# Patient Record
Sex: Male | Born: 1935 | Race: White | Hispanic: No | Marital: Married | Smoking: Former smoker
Health system: Southern US, Community
[De-identification: ages and names within clinical notes are randomized; demographics above are authoritative.]

## PROBLEM LIST (undated history)

## (undated) DIAGNOSIS — K5790 Diverticulosis of intestine, part unspecified, without perforation or abscess without bleeding: Secondary | ICD-10-CM

## (undated) DIAGNOSIS — C801 Malignant (primary) neoplasm, unspecified: Secondary | ICD-10-CM

## (undated) DIAGNOSIS — I10 Essential (primary) hypertension: Secondary | ICD-10-CM

## (undated) DIAGNOSIS — J45909 Unspecified asthma, uncomplicated: Secondary | ICD-10-CM

## (undated) DIAGNOSIS — J111 Influenza due to unidentified influenza virus with other respiratory manifestations: Secondary | ICD-10-CM

## (undated) DIAGNOSIS — N200 Calculus of kidney: Secondary | ICD-10-CM

## (undated) HISTORY — PX: PROSTATECTOMY: SHX69

## (undated) HISTORY — PX: CATARACT EXTRACTION: SUR2

---

## 1998-03-21 ENCOUNTER — Emergency Department (HOSPITAL_COMMUNITY): Admission: EM | Admit: 1998-03-21 | Discharge: 1998-03-21 | Payer: Self-pay | Admitting: Emergency Medicine

## 1998-04-25 ENCOUNTER — Ambulatory Visit (HOSPITAL_COMMUNITY): Admission: RE | Admit: 1998-04-25 | Discharge: 1998-04-25 | Payer: Self-pay | Admitting: Urology

## 1998-04-25 ENCOUNTER — Encounter: Payer: Self-pay | Admitting: Urology

## 2000-01-29 ENCOUNTER — Other Ambulatory Visit: Admission: RE | Admit: 2000-01-29 | Discharge: 2000-01-29 | Payer: Self-pay | Admitting: Urology

## 2000-03-16 ENCOUNTER — Inpatient Hospital Stay (HOSPITAL_COMMUNITY): Admission: RE | Admit: 2000-03-16 | Discharge: 2000-03-19 | Payer: Self-pay | Admitting: Urology

## 2002-04-25 ENCOUNTER — Inpatient Hospital Stay (HOSPITAL_COMMUNITY): Admission: EM | Admit: 2002-04-25 | Discharge: 2002-04-27 | Payer: Self-pay | Admitting: Urology

## 2002-05-04 ENCOUNTER — Ambulatory Visit (HOSPITAL_BASED_OUTPATIENT_CLINIC_OR_DEPARTMENT_OTHER): Admission: RE | Admit: 2002-05-04 | Discharge: 2002-05-04 | Payer: Self-pay | Admitting: Urology

## 2002-06-03 ENCOUNTER — Ambulatory Visit (HOSPITAL_COMMUNITY): Admission: RE | Admit: 2002-06-03 | Discharge: 2002-06-03 | Payer: Self-pay | Admitting: Gastroenterology

## 2002-06-09 ENCOUNTER — Encounter: Admission: RE | Admit: 2002-06-09 | Discharge: 2002-06-09 | Payer: Self-pay | Admitting: Gastroenterology

## 2002-06-09 ENCOUNTER — Encounter: Payer: Self-pay | Admitting: Gastroenterology

## 2004-10-28 ENCOUNTER — Emergency Department (HOSPITAL_COMMUNITY): Admission: EM | Admit: 2004-10-28 | Discharge: 2004-10-28 | Payer: Self-pay | Admitting: Family Medicine

## 2012-11-18 ENCOUNTER — Encounter (HOSPITAL_COMMUNITY): Payer: Self-pay | Admitting: Adult Health

## 2012-11-18 ENCOUNTER — Emergency Department (HOSPITAL_COMMUNITY)
Admission: EM | Admit: 2012-11-18 | Discharge: 2012-11-18 | Disposition: A | Discharge status: Home / Self Care | Payer: Medicare Other | Attending: Emergency Medicine | Admitting: Emergency Medicine

## 2012-11-18 DIAGNOSIS — Z23 Encounter for immunization: Secondary | ICD-10-CM | POA: Insufficient documentation

## 2012-11-18 DIAGNOSIS — S01309A Unspecified open wound of unspecified ear, initial encounter: Secondary | ICD-10-CM | POA: Insufficient documentation

## 2012-11-18 DIAGNOSIS — S01311A Laceration without foreign body of right ear, initial encounter: Secondary | ICD-10-CM

## 2012-11-18 DIAGNOSIS — Y9289 Other specified places as the place of occurrence of the external cause: Secondary | ICD-10-CM | POA: Insufficient documentation

## 2012-11-18 DIAGNOSIS — W208XXA Other cause of strike by thrown, projected or falling object, initial encounter: Secondary | ICD-10-CM | POA: Insufficient documentation

## 2012-11-18 DIAGNOSIS — Y93I9 Activity, other involving external motion: Secondary | ICD-10-CM | POA: Insufficient documentation

## 2012-11-18 MED ORDER — CEPHALEXIN 500 MG PO CAPS: 500.0000 mg | ORAL_CAPSULE | Freq: Four times a day (QID) | ORAL | Status: DC

## 2012-11-18 MED ORDER — OXYCODONE-ACETAMINOPHEN 5-325 MG PO TABS
2.0000 | ORAL_TABLET | Freq: Once | ORAL | Status: AC
  Administered 2012-11-18: 2 via ORAL
  Filled 2012-11-18: qty 2

## 2012-11-18 MED ORDER — CEPHALEXIN 250 MG PO CAPS
500.0000 mg | ORAL_CAPSULE | Freq: Once | ORAL | Status: AC
  Administered 2012-11-18: 500 mg via ORAL
  Filled 2012-11-18: qty 2

## 2012-11-18 MED ORDER — ONDANSETRON 4 MG PO TBDP: 8.0000 mg | ORAL_TABLET | Freq: Once | ORAL | Status: DC

## 2012-11-18 MED ORDER — ONDANSETRON 4 MG PO TBDP
ORAL_TABLET | ORAL | Status: AC
  Administered 2012-11-18: 4 mg via ORAL
  Filled 2012-11-18: qty 1

## 2012-11-18 MED ORDER — TETANUS-DIPHTH-ACELL PERTUSSIS 5-2.5-18.5 LF-MCG/0.5 IM SUSP
0.5000 mL | Freq: Once | INTRAMUSCULAR | Status: AC
  Administered 2012-11-18: 0.5 mL via INTRAMUSCULAR
  Filled 2012-11-18: qty 0.5

## 2012-11-18 MED ORDER — ONDANSETRON 4 MG PO TBDP
4.0000 mg | ORAL_TABLET | Freq: Once | ORAL | Status: AC
  Administered 2012-11-18: 4 mg via ORAL

## 2012-11-18 MED ORDER — HYDROCODONE-ACETAMINOPHEN 5-325 MG PO TABS: 1.0000 | ORAL_TABLET | ORAL | Status: DC | PRN

## 2012-11-18 NOTE — ED Notes (Signed)
Pressure dressing done.Wound still ozzy.

## 2012-11-18 NOTE — ED Provider Notes (Signed)
Suffered laceration to right ear after he was struck with a tree branch. No other injury. No loss of consciousness.  Doug Sou, MD 11/18/12 [phone]

## 2012-11-18 NOTE — ED Notes (Signed)
Held pressure for 10 minutes with quick-clot.  Ear continues to bleed.

## 2012-11-18 NOTE — ED Provider Notes (Signed)
History    This chart was scribed for Marlon Pel (PA) non-physician practitioner working with Doug Sou, MD by Sofie Rower, ED Scribe. This patient was seen in room TR11C/TR11C and the patient's care was started at 6:00PM.   CSN: [id number]  Arrival date & time 11/18/12  1650   First MD Initiated Contact with Patient 11/18/12 1800      Chief Complaint  Patient presents with  . Ear Laceration    (Consider location/radiation/quality/duration/timing/severity/associated sxs/prior treatment) The history is provided by the patient. No language interpreter was used.    Robert Curry is a 79 y.o. male , with no known medical hx, who presents to the Emergency Department complaining of sudden, moderate, ear laceration, located at the right ear, onset today (11/18/12). The pt reports he was riding his lawn mower underneath an apple tree earlier this afternoon, when a branch from the apple tree suddenly fell, impacting upon his right ear. The pt informs he is not taking any blood thinners at the present point and time. Furthermore, the pt reports his tetanus immunization is not up to date.  The pt denies any LOC.   The pt does not smoke or drink alcohol.   PCP is Dr. Clarene Duke.    History reviewed. No pertinent past medical history.  History reviewed. No pertinent past surgical history.  History reviewed. No pertinent family history.  History  Substance Use Topics  . Smoking status: Never Smoker   . Smokeless tobacco: Not on file  . Alcohol Use: No      Review of Systems  HENT: Positive for ear pain.   Skin: Positive for wound.  All other systems reviewed and are negative.    Allergies  Review of patient's allergies indicates no known allergies.  Home Medications   Current Outpatient Rx  Name  Route  Sig  Dispense  Refill  . naproxen sodium (ANAPROX) 220 MG tablet   Oral   Take 220 mg by mouth 2 (two) times daily as needed (pain).         . cephALEXin (KEFLEX) 500  MG capsule   Oral   Take 1 capsule (500 mg total) by mouth 4 (four) times daily.   40 capsule   0   . HYDROcodone-acetaminophen (NORCO/VICODIN) 5-325 MG per tablet   Oral   Take 1 tablet by mouth every 4 (four) hours as needed for pain.   15 tablet   0     BP 160/98  Pulse 94  Temp(Src) 97.8 F (36.6 C) (Oral)  Resp 16  SpO2 100%  Physical Exam  Nursing note and vitals reviewed. Constitutional: He is oriented to person, place, and time. He appears well-developed and well-nourished. No distress.  HENT:  Head: Normocephalic and atraumatic.  Right Ear: Tympanic membrane and ear canal normal. Right ear exhibits lacerations (4 cm laceration to ear that is actively bleeding).  Ears:  Eyes: EOM are normal.  Neck: Neck supple. No tracheal deviation present.  Cardiovascular: Normal rate.   Pulmonary/Chest: Effort normal. No respiratory distress.  Musculoskeletal: Normal range of motion.  Neurological: He is alert and oriented to person, place, and time.  Skin: Skin is warm and dry.  Psychiatric: He has a normal mood and affect. His behavior is normal.    ED Course  Procedures (including critical care time)  DIAGNOSTIC STUDIES: Oxygen Saturation is 100% on room air, normal by my interpretation.    COORDINATION OF CARE:  6:04 PM- Treatment plan concerning consultation with attending  physician discussed with patient. Pt agrees with treatment.  6:07 PM- Recheck. Treatment plan concerning pain management discussed with patient. Pt agrees with treatment.  6:08 PM- Attending physician (Dr. Ethelda Chick) evaluates patient. Dr. Ethelda Chick recommends ear block, laceration repair, ear dressing, and follow up with ENT. Treatment plan discussed with patient. Pt agrees with treatment.   6:12 PM- Treatment plan concerning laceration repair discussed with patient. Pt agrees with treatment.  LACERATION REPAIR PROCEDURE NOTE The patient's identification was confirmed and consent was  obtained. This procedure was performed by Marlon Pel (PA), MD at 6:13 PM. Site: Right ear Sterile procedures observed: Betadine Anesthetic used (type and amt): Lidocaine 2% Suture type/size: Prolene 3-0, Prolene 4-0 Length: 4CM # of Sutures: 7 Technique:SIMPLE INTERRUPTED  ComplexityComplex Antibx ointment applied: bacitracin Tetanus  ordered Site anesthetized, irrigated with NS, explored without evidence of foreign body, wound well approximated, site covered with dry, sterile dressing.  Patient tolerated procedure well without complications. Instructions for care discussed verbally and patient provided with additional written instructions for homecare and f/u.     6:57PM- Recheck. Dr. Ethelda Chick evaluates patient. Treatment plan discussed with patient. Pt agrees with treatment.           Labs Reviewed - No data to display No results found.   1. Laceration of ear without foreign body, right, initial encounter       MDM  I spoke with ENT Dr. Ezzard Standing who has asked patient to follow-up in his office. Pt is to call tomorrow.  Dr. Ethelda Chick evaluated wound after I completed procedure. Patient will follow-up with ENT int he next few days. Sutures to be removed by ENT.  Started on Keflex and given Rx for Vicodin for pain.  Pt has been advised of the symptoms that warrant their return to the ED. Patient has voiced understanding and has agreed to follow-up with the PCP or specialist.  I personally performed the services described in this documentation, which was scribed in my presence. The recorded information has been reviewed and is accurate.   Dorthula Matas, PA-C 11/18/12 1910  Dorthula Matas, PA-C 11/18/12 1959

## 2012-11-18 NOTE — ED Notes (Addendum)
Presents with right ear laceration from a tree branch. Laceration to outer ear. Tetanus not up to date.  No blood thinners.

## 2012-11-18 NOTE — ED Notes (Signed)
Pt discharged.Vital signs stable and GCS 15 

## 2012-11-18 NOTE — ED Notes (Signed)
Dr Shela Commons assessed pt. PA suturing ear.

## 2012-11-19 NOTE — ED Provider Notes (Signed)
Medical screening examination/treatment/procedure(s) were conducted as a shared visit with non-physician practitioner(s) and myself.  I personally evaluated the patient during the encounter  Doug Sou, MD 11/19/12 [phone]

## 2014-09-25 ENCOUNTER — Ambulatory Visit (INDEPENDENT_AMBULATORY_CARE_PROVIDER_SITE_OTHER): Payer: Medicare Other | Admitting: Family Medicine

## 2014-09-25 VITALS — BP 149/80 | HR 62 | Temp 98.2°F | Resp 18 | Wt 188.0 lb

## 2014-09-25 DIAGNOSIS — S40862A Insect bite (nonvenomous) of left upper arm, initial encounter: Secondary | ICD-10-CM

## 2014-09-25 DIAGNOSIS — W57XXXA Bitten or stung by nonvenomous insect and other nonvenomous arthropods, initial encounter: Secondary | ICD-10-CM | POA: Diagnosis not present

## 2014-09-25 MED ORDER — TRIAMCINOLONE ACETONIDE 0.1 % EX CREA: 1.0000 "application " | TOPICAL_CREAM | Freq: Three times a day (TID) | CUTANEOUS | Status: DC

## 2014-09-25 NOTE — Patient Instructions (Signed)
No antibiotics are indicated at this time. It is not been shown that taking antibiotics helps to prevent someone from getting problems. However, if you have any symptoms, you should come in immediately as it is very important that he get treated at that time. These would include fever, rashes, or severe headache or body aches. They usually occur after a week or more. However since you do not know exactly when you got the bite, come in at any time if getting symptoms.  If the bite is itching too much you can use a little triamcinolone cream on there several times a day.

## 2014-09-25 NOTE — Progress Notes (Signed)
Subjective: 81 year old man who had a tick on his left arm the removed earlier today and flushed down the toilet. He has a lot of itching where he got bit. The pharmacist recommended he come over here and get treated. He says he has had tick bites all his life and never had a problem from them. He does have a headache today. The tick probably got on their yesterday. It was still very tiny. He has been working outdoors. He has not had any fevers or rashes.  Objective: Palpable induration about 1 cm in diameter on the left elbow where he was bit. His rapid skin makes it such that I cannot tell exactly where the bite was.  Assessment: Tick bite  Plan: Triamcinolone cream if needed for itching No antibiotic prophylaxis indicated at this time Return if any symptoms at all

## 2014-10-04 ENCOUNTER — Emergency Department (HOSPITAL_COMMUNITY): Payer: Medicare Other

## 2014-10-04 ENCOUNTER — Emergency Department (HOSPITAL_COMMUNITY)
Admission: EM | Admit: 2014-10-04 | Discharge: 2014-10-04 | Disposition: A | Discharge status: Home / Self Care | Payer: Medicare Other | Attending: Emergency Medicine | Admitting: Emergency Medicine

## 2014-10-04 ENCOUNTER — Encounter (HOSPITAL_COMMUNITY): Payer: Self-pay | Admitting: Emergency Medicine

## 2014-10-04 DIAGNOSIS — I10 Essential (primary) hypertension: Secondary | ICD-10-CM | POA: Diagnosis not present

## 2014-10-04 DIAGNOSIS — Z8719 Personal history of other diseases of the digestive system: Secondary | ICD-10-CM | POA: Insufficient documentation

## 2014-10-04 DIAGNOSIS — N201 Calculus of ureter: Secondary | ICD-10-CM | POA: Diagnosis not present

## 2014-10-04 DIAGNOSIS — N23 Unspecified renal colic: Secondary | ICD-10-CM | POA: Diagnosis not present

## 2014-10-04 DIAGNOSIS — R109 Unspecified abdominal pain: Secondary | ICD-10-CM | POA: Diagnosis present

## 2014-10-04 DIAGNOSIS — Z87891 Personal history of nicotine dependence: Secondary | ICD-10-CM | POA: Diagnosis not present

## 2014-10-04 HISTORY — DX: Diverticulosis of intestine, part unspecified, without perforation or abscess without bleeding: K57.90

## 2014-10-04 HISTORY — DX: Essential (primary) hypertension: I10

## 2014-10-04 HISTORY — DX: Calculus of kidney: N20.0

## 2014-10-04 LAB — URINALYSIS, ROUTINE W REFLEX MICROSCOPIC
Bilirubin Urine: NEGATIVE
Glucose, UA: NEGATIVE mg/dL
Hgb urine dipstick: NEGATIVE
Ketones, ur: NEGATIVE mg/dL
Leukocytes, UA: NEGATIVE
NITRITE: NEGATIVE
Protein, ur: NEGATIVE mg/dL
SPECIFIC GRAVITY, URINE: 1.026 (ref 1.005–1.030)
UROBILINOGEN UA: 0.2 mg/dL (ref 0.0–1.0)
pH: 5 (ref 5.0–8.0)

## 2014-10-04 MED ORDER — OXYCODONE-ACETAMINOPHEN 5-325 MG PO TABS: ORAL_TABLET | ORAL | Status: DC

## 2014-10-04 MED ORDER — OXYCODONE-ACETAMINOPHEN 5-325 MG PO TABS
2.0000 | ORAL_TABLET | Freq: Once | ORAL | Status: AC
  Administered 2014-10-04: 2 via ORAL
  Filled 2014-10-04: qty 2

## 2014-10-04 MED ORDER — NAPROXEN 250 MG PO TABS: 250.0000 mg | ORAL_TABLET | Freq: Two times a day (BID) | ORAL | Status: DC

## 2014-10-04 MED ORDER — ONDANSETRON HCL 4 MG PO TABS: 4.0000 mg | ORAL_TABLET | Freq: Three times a day (TID) | ORAL | Status: DC | PRN

## 2014-10-04 NOTE — ED Provider Notes (Signed)
CSN: [id number]     Arrival date & time 10/04/14  1641 History   First MD Initiated Contact with Patient 10/04/14 1723     Chief Complaint  Patient presents with  . Nephrolithiasis  . Flank Pain      HPI Pt was seen at 1755. Per pt, c/o sudden onset and persistence of waxing and waning left sided flank "pain" that began 2 days ago.  Pt describes the pain as "like my last kidney stone 15 years ago." States "I feel bloated." Has been associated with nausea. States he took an alleve at 1430 PTA without improvement in his symptoms. Pt states he called his Urologist's office PTA, and was sent to the ED for further evaluation. Denies testicular pain/swelling, no dysuria/hematuria, no abd pain, no vomiting/diarrhea, no black or blood in emesis, no CP/SOB.    Uro: [facility] Dr. Junious Silk Past Medical History  Diagnosis Date  . Kidney stones   . Hypertension   . Diverticulosis    Past Surgical History  Procedure Laterality Date  . Prostatectomy      History  Substance Use Topics  . Smoking status: Former Research scientist (life sciences)  . Smokeless tobacco: Not on file  . Alcohol Use: No    Review of Systems ROS: Statement: All systems negative except as marked or noted in the HPI; Constitutional: Negative for fever and chills. ; ; Eyes: Negative for eye pain, redness and discharge. ; ; ENMT: Negative for ear pain, hoarseness, nasal congestion, sinus pressure and sore throat. ; ; Cardiovascular: Negative for chest pain, palpitations, diaphoresis, dyspnea and peripheral edema. ; ; Respiratory: Negative for cough, wheezing and stridor. ; ; Gastrointestinal: +nausea. Negative for vomiting, diarrhea, abdominal pain, blood in stool, hematemesis, jaundice and rectal bleeding. . ; ; Genitourinary: +flank pain. Negative for dysuria and hematuria. ; ; Genital:  No penile drainage or rash, no testicular pain or swelling, no scrotal rash or swelling. ;; Musculoskeletal: Negative for back pain and neck pain. Negative for swelling  and trauma.; ; Skin: Negative for pruritus, rash, abrasions, blisters, bruising and skin lesion.; ; Neuro: Negative for headache, lightheadedness and neck stiffness. Negative for weakness, altered level of consciousness , altered mental status, extremity weakness, paresthesias, involuntary movement, seizure and syncope.       Allergies  Review of patient's allergies indicates no known allergies.  Home Medications   Prior to Admission medications   Medication Sig Start Date End Date Taking? Authorizing Provider  naproxen sodium (ANAPROX) 220 MG tablet Take 220 mg by mouth 2 (two) times daily with a meal.   Yes Historical Provider, MD  triamcinolone cream (KENALOG) 0.1 % Apply 1 application topically 3 (three) times daily. Patient not taking: Reported on 10/04/2014 09/25/14   Posey Boyer, MD   BP 164/77 mmHg  Pulse 80  Temp(Src) 98 F (36.7 C) (Oral)  Resp 17  SpO2 99% Physical Exam  1800; Physical examination:  Nursing notes reviewed; Vital signs and O2 SAT reviewed;  Constitutional: Well developed, Well nourished, Well hydrated, In no acute distress; Head:  Normocephalic, atraumatic; Eyes: EOMI, PERRL, No scleral icterus; ENMT: Mouth and pharynx normal, Mucous membranes moist; Neck: Supple, Full range of motion, No lymphadenopathy; Cardiovascular: Regular rate and rhythm, No gallop; Respiratory: Breath sounds clear & equal bilaterally, No wheezes.  Speaking full sentences with ease, Normal respiratory effort/excursion; Chest: Nontender, Movement normal; Abdomen: Soft, Nontender, Nondistended, Normal bowel sounds; Genitourinary: No CVA tenderness; Spine:  No midline CS, TS, LS tenderness.;; Extremities: Pulses normal, No tenderness,  No edema, No calf edema or asymmetry.; Neuro: AA&Ox3, Major CN grossly intact.  Speech clear. No gross focal motor or sensory deficits in extremities.; Skin: Color normal, Warm, Dry.   ED Course  Procedures     EKG Interpretation None      MDM   MDM Reviewed: previous chart, nursing note and vitals Reviewed previous: labs Interpretation: labs and CT scan     Results for orders placed or performed during the hospital encounter of 10/04/14  Urinalysis, Routine w reflex microscopic  Result Value Ref Range   Color, Urine YELLOW YELLOW   APPearance CLEAR CLEAR   Specific Gravity, Urine 1.026 1.005 - 1.030   pH 5.0 5.0 - 8.0   Glucose, UA NEGATIVE NEGATIVE mg/dL   Hgb urine dipstick NEGATIVE NEGATIVE   Bilirubin Urine NEGATIVE NEGATIVE   Ketones, ur NEGATIVE NEGATIVE mg/dL   Protein, ur NEGATIVE NEGATIVE mg/dL   Urobilinogen, UA 0.2 0.0 - 1.0 mg/dL   Nitrite NEGATIVE NEGATIVE   Leukocytes, UA NEGATIVE NEGATIVE   Ct Abdomen Pelvis Wo Contrast 10/04/2014   CLINICAL DATA:  Left flank pain  EXAM: CT ABDOMEN AND PELVIS WITHOUT CONTRAST  TECHNIQUE: Multidetector CT imaging of the abdomen and pelvis was performed following the standard protocol without IV contrast.  COMPARISON:  None.  FINDINGS: Lower chest: No pleural or pericardial effusion. Atelectasis is noted within the posterior lung bases.  Hepatobiliary: No suspicious liver abnormalities identified. Multiple stones identified within the gallbladder measuring up to 6 mm. No gallbladder wall thickening or pericholecystic fluid. There is no biliary dilatation.  Pancreas: Negative  Spleen: The spleen appears normal.  Adrenals/Urinary Tract: Normal adrenal glands. Right renal cyst containing mural calcification measures 2.2 cm, image 34/series 2. This is incompletely characterized without IV contrast. Punctate calcification within the right kidney measures 2-3 mm, image 34/series 2. There is asymmetric left-sided hydronephrosis. At the left UPJ there is a stone which measures 7 mm, image 39/series 2. Mild left-sided perinephric fat stranding. Urinary bladder appears normal for degree of distention.  Stomach/Bowel: The stomach appears normal. The small bowel loops have a normal course and  caliber without obstruction. The appendix is visualized and appears normal. Multiple distal colonic diverticula identified without acute inflammation.  Vascular/Lymphatic: Calcified atherosclerotic disease involves the abdominal aorta. No aneurysm. No enlarged retroperitoneal or mesenteric adenopathy. No enlarged pelvic or inguinal lymph nodes. Previous pelvic lymph node dissection.  Reproductive: Previous prostatectomy.  Other: There is no ascites or focal fluid collections within the abdomen or pelvis.  Musculoskeletal: No aggressive lytic or sclerotic bone lesion identified.  IMPRESSION: 1. Asymmetric left-sided hydronephrosis secondary to 7 mm UPJ calculus. 2. Complex right renal cyst containing mural calcification identified. More definitive assessment with renal protocol CT or contrast enhanced renal MRI is advised. 3. Atherosclerotic disease. 4. Prior prostatectomy. No specific features identified to suggest metastatic disease. 5. Gallstones.   Electronically Signed   By: Kerby Moors M.D.   On: 10/04/2014 18:23    2020:  Pt states he feels "much better now" and wants to go home. Pt has tol PO well without N/V. VS remain stable. T/C to Urology Dr. Matilde Sprang, case discussed, including:  HPI, pertinent PM/SHx, VS/PE, dx testing, ED course and treatment:  Agrees with ED workup/tx, requests to have pt call the office tomorrow for f/u appt. Dx and testing, as well as d/w Urologist, d/w pt and family.  Questions answered.  Verb understanding, agreeable to d/c home with outpt f/u.   Francine Graven, DO 10/07/14  0722 

## 2014-10-04 NOTE — Discharge Instructions (Signed)
°Emergency Department Resource Guide °1) Find a Doctor and Pay Out of Pocket °Although you won't have to find out who is covered by your insurance plan, it is a good idea to ask around and get recommendations. You will then need to call the office and see if the doctor you have chosen will accept you as a new patient and what types of options they offer for patients who are self-pay. Some doctors offer discounts or will set up payment plans for their patients who do not have insurance, but you will need to ask so you aren't surprised when you get to your appointment. ° °2) Contact Your Local Health Department °Not all health departments have doctors that can see patients for sick visits, but many do, so it is worth a call to see if yours does. If you don't know where your local health department is, you can check in your phone book. The CDC also has a tool to help you locate your state's health department, and many state websites also have listings of all of their local health departments. ° °3) Find a Walk-in Clinic °If your illness is not likely to be very severe or complicated, you may want to try a walk in clinic. These are popping up all over the country in pharmacies, drugstores, and shopping centers. They're usually staffed by nurse practitioners or physician assistants that have been trained to treat common illnesses and complaints. They're usually fairly quick and inexpensive. However, if you have serious medical issues or chronic medical problems, these are probably not your best option. ° °No Primary Care Doctor: °- Call [facility] at  832-8000 - they can help you locate a primary care doctor that  accepts your insurance, provides certain services, etc. °- Physician Referral Service- 1-800-533-3463 ° °Chronic Pain Problems: °Organization         Address  Phone   Notes  °[facility]  (336) 297-2271 Patients need to be referred by their primary care doctor.  ° °Medication  Assistance: °Organization         Address  Phone   Notes  °[place] [facility] [address] °[place], [place] [address] (336) 641-8030 --Must be a resident of [place] °-- Must have NO insurance coverage whatsoever (no Medicaid/ Medicare, etc.) °-- The pt. MUST have a primary care doctor that directs their care regularly and follows them in the community °  °[facility]  (866) 331-1348   °[employer]  (888) 892-1162   ° °Agencies that provide inexpensive medical care: °Organization         Address  Phone   Notes  °[facility]  (336) 832-8035   °[facility]    (336) 832-7272   °[facility] [address] °[place], [place] [address] (336) 832-4777   °[facility] [address], °[place] (336) 271-4999   °[facility]    (336) 373-0678   °[facility]    (336) 272-1050   °[facility] ° [address], [place] Phone:  (336) 832-4444, Fax:  (336) 832-4440 Hours of Operation:  9 am - 6 pm, M-F.  Also accepts Medicaid/Medicare and self-pay.  °[facility] ° [address], [place] Phone: (336) 832-3150, Fax: (336) [phone]. Hours of Operation:  8:30 am - 5:30 pm, M-F.  Also accepts Medicaid and self-pay.  °[facility] [place] [address], [place] Phone: (336) 878-6027   °[facility] [address], [place], [place] (336)723-1848, Ext. 123 Mondays & Thursdays: 7-9 AM.  First 15 patients are seen on a first come, first serve basis. °  ° °Medicaid-accepting [place] Providers: ° °Organization         Address  Phone   Notes  °[facility] [address], [place] (336) 641-2100 Also accepts self-pay patients.  °[facility] [address], [place] ° (336) 856-9996   °[facility] [address], [place]  (336) 288-8857   °[facility] [address] [place] [address], [place] (336) 299-7000   °Veita Bland [address], [place]  ° (336) 373-1557 Only accepts [place] Access Medicaid patients after they have their name applied to their card.  ° °Self-Pay (no insurance) in [place]: ° °Organization         Address  Phone   Notes  °Sickle Cell Patients, [facility] [address], [place] (336) 832-1970   °[facility] [address], [place] (336) 832-4400   °[facility] ° [address], [place] (336) 992-4800   °[facility]/Dr. Osei-Bonsu ° [address] [place] [address], [place] or [address], [place] (336) [phone] Phone number for both [place] and [place] locations is the same.  °[facility] [address], [place] (336) 299-0000   °[facility] [address] [place] [address], [place] or [address] (336) 852-7530 °(336) 878-2260   °[facility] [address], [place] (336) 350-1642, phone; (336) 294-5005, fax Sees patients 1st and 3rd Saturday of every month.  Must not qualify for public or private insurance (i.e. Medicaid, Medicare, [place] Health Choice, Veterans' Benefits) • Household income should be no more than 200% of the poverty level •The clinic cannot treat you if you are pregnant or think you are pregnant • Sexually transmitted diseases are not treated at the clinic.  ° ° °Dental Care: °Organization         Address  Phone  Notes  °[place] [facility] [address],  (336) 641-6152 Accepts children up to age 21 who are enrolled in Medicaid or [place] Health Choice; pregnant women with a Medicaid card; and children who have applied for Medicaid or [place] Health Choice, but were declined, whose parents can pay a reduced fee at time of service.  °[place]  [facility] [place]  [address], [place] (336) 641-7733 Accepts children up to age 21 who are enrolled in Medicaid or [place] Health Choice; pregnant women with a Medicaid card; and children who have applied for Medicaid or  Health Choice, but were declined, whose parents can pay a reduced fee at time of service.  °[facility] ° [address],  (336) 641-4533 Patients are seen by appointment only. Walk-ins are not accepted. [facility] will see patients 18 years of age and older. °Monday - Tuesday (8am-5pm) °Most Wednesdays (8:30-5pm) °$30 per visit, cash only  °[facility] ° [address], [place] (336) 641-4533 Patients are seen by appointment only. Walk-ins are not accepted. [facility] will see patients 18 years of age and older. °One   Wednesday Evening (Monthly: Volunteer Based).  $30 per visit, cash only  °[facility]  (919) [phone] for adults; Children under age 4, call Graduate Pediatric Dentistry at ([phone]) [phone]. Children aged 4-14, please call ([phone]) [phone] to request a pediatric application. ° Dental services are provided in all areas of dental care including fillings, crowns and bridges, complete and partial dentures, implants, gum treatment, root canals, and extractions. Preventive care is also provided. Treatment is provided to both adults and children. °Patients are selected via a lottery and there is often a waiting list. °  °[facility] [address], °[place] ° (336) 763-8833 [email or web address] °  °[facility] [address], [place], [place] (336)723-1848, Ext. 123 Second and Fourth Thursday of each month, opens at 6:30 AM; Clinic ends at 9 AM.  Patients are seen on a first-come first-served basis, and a limited number are seen during each clinic.  ° °[facility] ° [address], [place], [place] (336) [phone]    Eligibility Requirements °You must have lived in [place], [place], or [place] counties for at least the last three months. °  You cannot be eligible for state or federal sponsored healthcare insurance, including Veterans Administration, Medicaid, or Medicare. °  You generally cannot be eligible for healthcare insurance through your employer.  °  How to apply: °Eligibility screenings are held every Tuesday and Wednesday afternoon from 1:00 pm until 4:00 pm. You do not need an appointment for the interview!  °[facility] [address], [place], [place] 336-631-2330   °[facility]  336-342-8273   °[facility]  336-703-3100   °[facility]  336-570-6415   ° °Behavioral Health Resources in the Community: °Intensive Outpatient Programs °Organization         Address  Phone  Notes  °[facility] [address], [place], [place] 336-878-6098   °[facility] [address], [place], [place] 336-832-9800   °[facility] [address], [place], [place] ° 336-882-2125   °[facility] [address],  °[place], [place] 1-800-853-5163 or 336-641-4981   °Substance Abuse Resources °Organization         Address  Phone  Notes  °[facility]  336-882-2125   °[facility]  336-784-9470   °[facility]  336-285-9073   °[facility]  336-845-3988   °Residential & Outpatient Substance Abuse Program  1-800-659-3381   °Psychological Services °Organization         Address  Phone  Notes  ° Health  336- 832-9600   °[facility]  336- 378-7881   °[facility] [address], [place] 1-800-853-5163 or 336-641-4981   ° °Mobile Crisis Teams °Organization         Address  Phone  Notes  °[facility], Mobile Crisis Care Unit  1-877-626-1772   °Assertive °[facility] ° [address]  [place], [place] 336-834-9664   °Sharon DeEsch [address] °[place] 336-554-5454   ° °Self-Help/Support Groups °Organization         Address  Phone             Notes  °[facility] - variety of support groups  336- 373-1402 Call for more information  °Narcotics Anonymous (NA), [facility] [address], °[place]  2 meetings at this location  ° °  Residential Treatment Programs Organization         Address  Phone  Notes  [facility] [address]    [place]  1-(804) 033-0221   [facility]  [address], [place] [address], [place], [place]   [facility] [place], [place] 530-450-1218 Admissions: 8am-3pm M-F  [facility] [place] [address],    [place], [place] 341-937-9024   [facility] [address] [place], [place], [place]   [facility] [address]  [place], [place]   [facility] Intensive Outpatient [address], [place], [place]   [facility]  [place], [place] 1-662-772-9761 or 325-490-0771   [facility] [address], [place], [place] Accepts Medicaid  [facility] [address],  [place] 1-(640)469-1042 Substance Abuse/Addiction Treatment   [facility]         Address  Phone  Notes  [facility]  732-655-7719   Domenic Schwab, PhD [address] [place], [place]   918-127-7624 or 210-089-9695   [place], [place] 646-843-4364   [facility] [address] [place] [address], [place], [place] 339-705-6224 Insurance/Medicaid/sponsorship through [facility] [address] [place]                                    [place], [place] 515-701-2989 [place] [address], [place] [phone]    Dr. Adele Schilder  [phone]   [facility] Dept. 1) [address] [place] 2) [place] 3)  [address], [place] [phone]  [phone]   [place] 629-406-1416 or ([phone] (After Hours)      Take the prescriptions as directed.  Call your Urologist tomorrow to schedule a follow up appointment in the next 2 days.  Return to the Emergency Department immediately if worsening.

## 2014-10-04 NOTE — ED Notes (Addendum)
Pt c/o left flank pain starting x2 days. [facility] and the office referred patient here. C/o dysuria. Denies N/V/D/fevers. No other c/c. Hx kidney stones. Took one Aleve at 1430 with no alleviation of symptoms. Also c/o abdominal bloating. Does not feel like he needs to urinate at this time.

## 2014-10-05 LAB — URINE CULTURE: Colony Count: 15000

## 2015-08-09 ENCOUNTER — Ambulatory Visit
Admission: RE | Admit: 2015-08-09 | Discharge: 2015-08-09 | Disposition: A | Discharge status: Home / Self Care | Payer: Medicare Other | Source: Ambulatory Visit | Attending: Family Medicine | Admitting: Family Medicine

## 2015-08-09 ENCOUNTER — Other Ambulatory Visit: Payer: Self-pay | Admitting: Family Medicine

## 2015-08-09 DIAGNOSIS — R05 Cough: Secondary | ICD-10-CM

## 2015-08-09 DIAGNOSIS — R059 Cough, unspecified: Secondary | ICD-10-CM

## 2015-12-04 ENCOUNTER — Other Ambulatory Visit: Payer: Self-pay

## 2015-12-04 ENCOUNTER — Encounter (HOSPITAL_COMMUNITY)
Admission: EM | Disposition: A | Discharge status: Home / Self Care | Payer: Self-pay | Source: Home / Self Care | Attending: Cardiology

## 2015-12-04 ENCOUNTER — Emergency Department (HOSPITAL_COMMUNITY): Payer: Medicare Other

## 2015-12-04 ENCOUNTER — Inpatient Hospital Stay (HOSPITAL_COMMUNITY)
Admission: EM | Admit: 2015-12-04 | Discharge: 2015-12-05 | DRG: 287 | Disposition: A | Discharge status: Home / Self Care | Payer: Medicare Other | Attending: Cardiology | Admitting: Cardiology

## 2015-12-04 ENCOUNTER — Encounter (HOSPITAL_COMMUNITY): Payer: Self-pay | Admitting: Emergency Medicine

## 2015-12-04 DIAGNOSIS — R0902 Hypoxemia: Secondary | ICD-10-CM | POA: Diagnosis present

## 2015-12-04 DIAGNOSIS — R0600 Dyspnea, unspecified: Secondary | ICD-10-CM

## 2015-12-04 DIAGNOSIS — I739 Peripheral vascular disease, unspecified: Secondary | ICD-10-CM | POA: Diagnosis present

## 2015-12-04 DIAGNOSIS — I248 Other forms of acute ischemic heart disease: Principal | ICD-10-CM | POA: Diagnosis present

## 2015-12-04 DIAGNOSIS — I2510 Atherosclerotic heart disease of native coronary artery without angina pectoris: Secondary | ICD-10-CM | POA: Diagnosis present

## 2015-12-04 DIAGNOSIS — E782 Mixed hyperlipidemia: Secondary | ICD-10-CM | POA: Diagnosis present

## 2015-12-04 DIAGNOSIS — I10 Essential (primary) hypertension: Secondary | ICD-10-CM | POA: Diagnosis present

## 2015-12-04 DIAGNOSIS — Z859 Personal history of malignant neoplasm, unspecified: Secondary | ICD-10-CM

## 2015-12-04 DIAGNOSIS — I214 Non-ST elevation (NSTEMI) myocardial infarction: Secondary | ICD-10-CM | POA: Diagnosis present

## 2015-12-04 DIAGNOSIS — Z87891 Personal history of nicotine dependence: Secondary | ICD-10-CM

## 2015-12-04 DIAGNOSIS — J45909 Unspecified asthma, uncomplicated: Secondary | ICD-10-CM | POA: Diagnosis present

## 2015-12-04 DIAGNOSIS — I2584 Coronary atherosclerosis due to calcified coronary lesion: Secondary | ICD-10-CM | POA: Diagnosis present

## 2015-12-04 DIAGNOSIS — I700 Atherosclerosis of aorta: Secondary | ICD-10-CM | POA: Diagnosis present

## 2015-12-04 HISTORY — PX: CARDIAC CATHETERIZATION: SHX172

## 2015-12-04 HISTORY — DX: Malignant (primary) neoplasm, unspecified: C80.1

## 2015-12-04 HISTORY — DX: Influenza due to unidentified influenza virus with other respiratory manifestations: J11.1

## 2015-12-04 LAB — I-STAT CHEM 8, ED
BUN: 25 mg/dL — ABNORMAL HIGH (ref 6–20)
CALCIUM ION: 1.21 mmol/L (ref 1.13–1.30)
CHLORIDE: 106 mmol/L (ref 101–111)
Creatinine, Ser: 1 mg/dL (ref 0.61–1.24)
Glucose, Bld: 111 mg/dL — ABNORMAL HIGH (ref 65–99)
HCT: 44 % (ref 39.0–52.0)
Hemoglobin: 15 g/dL (ref 13.0–17.0)
Potassium: 4 mmol/L (ref 3.5–5.1)
Sodium: 145 mmol/L (ref 135–145)
TCO2: 28 mmol/L (ref 0–100)

## 2015-12-04 LAB — POCT ACTIVATED CLOTTING TIME: ACTIVATED CLOTTING TIME: 214 s

## 2015-12-04 LAB — I-STAT TROPONIN, ED
TROPONIN I, POC: 0 ng/mL (ref 0.00–0.08)
Troponin i, poc: 0.12 ng/mL (ref 0.00–0.08)

## 2015-12-04 LAB — CBC WITH DIFFERENTIAL/PLATELET
BASOS PCT: 0 %
Basophils Absolute: 0 10*3/uL (ref 0.0–0.1)
EOS ABS: 0.4 10*3/uL (ref 0.0–0.7)
EOS PCT: 4 %
HEMATOCRIT: 43.1 % (ref 39.0–52.0)
Hemoglobin: 13.2 g/dL (ref 13.0–17.0)
Lymphocytes Relative: 32 %
Lymphs Abs: 2.9 10*3/uL (ref 0.7–4.0)
MCH: 29.3 pg (ref 26.0–34.0)
MCHC: 30.6 g/dL (ref 30.0–36.0)
MCV: 95.8 fL (ref 78.0–100.0)
MONO ABS: 0.6 10*3/uL (ref 0.1–1.0)
MONOS PCT: 6 %
Neutro Abs: 5.2 10*3/uL (ref 1.7–7.7)
Neutrophils Relative %: 58 %
PLATELETS: 211 10*3/uL (ref 150–400)
RBC: 4.5 MIL/uL (ref 4.22–5.81)
RDW: 13.3 % (ref 11.5–15.5)
WBC: 9.1 10*3/uL (ref 4.0–10.5)

## 2015-12-04 LAB — LIPID PANEL
CHOL/HDL RATIO: 3.2 ratio
CHOLESTEROL: 178 mg/dL (ref 0–200)
HDL: 56 mg/dL (ref >40)
LDL CALC: 117 mg/dL — AB (ref 0–99)
TRIGLYCERIDES: 24 mg/dL (ref <150)
VLDL: 5 mg/dL (ref 0–40)

## 2015-12-04 LAB — TROPONIN I
TROPONIN I: 0.29 ng/mL — AB (ref <0.031)
TROPONIN I: 0.33 ng/mL — AB (ref <0.031)
TROPONIN I: 0.41 ng/mL — AB (ref <0.031)

## 2015-12-04 LAB — I-STAT VENOUS BLOOD GAS, ED
Bicarbonate: 28.1 mEq/L — ABNORMAL HIGH (ref 20.0–24.0)
O2 SAT: 50 %
PCO2 VEN: 62 mmHg — AB (ref 45.0–50.0)
TCO2: 30 mmol/L (ref 0–100)
pH, Ven: 7.265 (ref 7.250–7.300)
pO2, Ven: 32 mmHg (ref 31.0–45.0)

## 2015-12-04 LAB — BRAIN NATRIURETIC PEPTIDE: B NATRIURETIC PEPTIDE 5: 51.3 pg/mL (ref 0.0–100.0)

## 2015-12-04 LAB — D-DIMER, QUANTITATIVE: D-Dimer, Quant: 2.49 ug/mL-FEU — ABNORMAL HIGH (ref 0.00–0.50)

## 2015-12-04 LAB — PROTIME-INR
INR: 1.08 (ref 0.00–1.49)
Prothrombin Time: 14.2 seconds (ref 11.6–15.2)

## 2015-12-04 SURGERY — LEFT HEART CATH AND CORONARY ANGIOGRAPHY

## 2015-12-04 MED ORDER — ATORVASTATIN CALCIUM 80 MG PO TABS: 80.0000 mg | ORAL_TABLET | Freq: Every day | ORAL | Status: DC

## 2015-12-04 MED ORDER — HEPARIN SODIUM (PORCINE) 1000 UNIT/ML IJ SOLN
INTRAMUSCULAR | Status: DC | PRN
  Administered 2015-12-04: 2000 [IU] via INTRAVENOUS
  Administered 2015-12-04: 6000 [IU] via INTRAVENOUS

## 2015-12-04 MED ORDER — MIDAZOLAM HCL 2 MG/2ML IJ SOLN
INTRAMUSCULAR | Status: DC | PRN
  Administered 2015-12-04: 1 mg via INTRAVENOUS

## 2015-12-04 MED ORDER — SODIUM CHLORIDE 0.9% FLUSH
3.0000 mL | Freq: Two times a day (BID) | INTRAVENOUS | Status: DC
  Administered 2015-12-04 – 2015-12-05 (×2): 3 mL via INTRAVENOUS

## 2015-12-04 MED ORDER — SODIUM CHLORIDE 0.9 % WEIGHT BASED INFUSION: 3.0000 mL/kg/h | INTRAVENOUS | Status: DC

## 2015-12-04 MED ORDER — FENTANYL CITRATE (PF) 100 MCG/2ML IJ SOLN
INTRAMUSCULAR | Status: DC | PRN
  Administered 2015-12-04: 25 ug via INTRAVENOUS

## 2015-12-04 MED ORDER — SODIUM CHLORIDE 0.9 % IV SOLN: 250.0000 mL | INTRAVENOUS | Status: DC | PRN

## 2015-12-04 MED ORDER — HEPARIN SODIUM (PORCINE) 5000 UNIT/ML IJ SOLN: 4000.0000 [IU] | Freq: Once | INTRAMUSCULAR | Status: DC

## 2015-12-04 MED ORDER — ASPIRIN EC 81 MG PO TBEC: 81.0000 mg | DELAYED_RELEASE_TABLET | Freq: Every day | ORAL | Status: DC

## 2015-12-04 MED ORDER — NITROGLYCERIN 0.4 MG SL SUBL
0.4000 mg | SUBLINGUAL_TABLET | SUBLINGUAL | Status: DC | PRN
Start: 2015-12-04 — End: 2015-12-05

## 2015-12-04 MED ORDER — IOPAMIDOL (ISOVUE-370) INJECTION 76%
INTRAVENOUS | Status: AC
  Filled 2015-12-04: qty 50

## 2015-12-04 MED ORDER — ASPIRIN EC 81 MG PO TBEC
81.0000 mg | DELAYED_RELEASE_TABLET | Freq: Every day | ORAL | Status: DC
  Administered 2015-12-04: 81 mg via ORAL
  Filled 2015-12-04: qty 1

## 2015-12-04 MED ORDER — SODIUM CHLORIDE 0.9% FLUSH: 3.0000 mL | Freq: Two times a day (BID) | INTRAVENOUS | Status: DC

## 2015-12-04 MED ORDER — SODIUM CHLORIDE 0.9 % WEIGHT BASED INFUSION: 1.0000 mL/kg/h | INTRAVENOUS | Status: DC

## 2015-12-04 MED ORDER — ONDANSETRON HCL 4 MG/2ML IJ SOLN: 4.0000 mg | Freq: Four times a day (QID) | INTRAMUSCULAR | Status: DC | PRN

## 2015-12-04 MED ORDER — IOPAMIDOL (ISOVUE-370) INJECTION 76%
INTRAVENOUS | Status: DC | PRN
  Administered 2015-12-04: 120 mL

## 2015-12-04 MED ORDER — VERAPAMIL HCL 2.5 MG/ML IV SOLN
INTRA_ARTERIAL | Status: DC | PRN
  Administered 2015-12-04: 5 mL via INTRA_ARTERIAL

## 2015-12-04 MED ORDER — IOPAMIDOL (ISOVUE-370) INJECTION 76%
INTRAVENOUS | Status: AC
  Filled 2015-12-04: qty 100

## 2015-12-04 MED ORDER — METOPROLOL TARTRATE 25 MG PO TABS
25.0000 mg | ORAL_TABLET | Freq: Two times a day (BID) | ORAL | Status: DC
  Administered 2015-12-04 – 2015-12-05 (×2): 25 mg via ORAL
  Filled 2015-12-04 (×3): qty 1

## 2015-12-04 MED ORDER — LIDOCAINE HCL (PF) 1 % IJ SOLN
INTRAMUSCULAR | Status: DC | PRN
  Administered 2015-12-04: 2 mL via INTRADERMAL

## 2015-12-04 MED ORDER — SODIUM CHLORIDE 0.9 % WEIGHT BASED INFUSION
3.0000 mL/kg/h | INTRAVENOUS | Status: AC
  Administered 2015-12-04 (×2): 3 mL/kg/h via INTRAVENOUS

## 2015-12-04 MED ORDER — ONDANSETRON HCL 4 MG/2ML IJ SOLN
4.0000 mg | Freq: Once | INTRAMUSCULAR | Status: DC
  Filled 2015-12-04: qty 2

## 2015-12-04 MED ORDER — ENOXAPARIN SODIUM 40 MG/0.4ML ~~LOC~~ SOLN
40.0000 mg | SUBCUTANEOUS | Status: DC
  Administered 2015-12-05: 40 mg via SUBCUTANEOUS
  Filled 2015-12-04: qty 0.4

## 2015-12-04 MED ORDER — HEPARIN (PORCINE) IN NACL 2-0.9 UNIT/ML-% IJ SOLN
INTRAMUSCULAR | Status: AC
  Filled 2015-12-04: qty 1000

## 2015-12-04 MED ORDER — HEPARIN (PORCINE) IN NACL 100-0.45 UNIT/ML-% IJ SOLN
1150.0000 [IU]/h | INTRAMUSCULAR | Status: DC
  Administered 2015-12-04: 1150 [IU]/h via INTRAVENOUS
  Filled 2015-12-04: qty 250

## 2015-12-04 MED ORDER — HEPARIN SODIUM (PORCINE) 1000 UNIT/ML IJ SOLN
INTRAMUSCULAR | Status: AC
  Filled 2015-12-04: qty 1

## 2015-12-04 MED ORDER — ACETAMINOPHEN 325 MG PO TABS: 650.0000 mg | ORAL_TABLET | ORAL | Status: DC | PRN

## 2015-12-04 MED ORDER — VERAPAMIL HCL 2.5 MG/ML IV SOLN
INTRAVENOUS | Status: AC
  Filled 2015-12-04: qty 2

## 2015-12-04 MED ORDER — HEPARIN (PORCINE) IN NACL 2-0.9 UNIT/ML-% IJ SOLN
INTRAMUSCULAR | Status: DC | PRN
  Administered 2015-12-04: 1000 mL

## 2015-12-04 MED ORDER — HEPARIN BOLUS VIA INFUSION
4000.0000 [IU] | Freq: Once | INTRAVENOUS | Status: AC
  Administered 2015-12-04: 4000 [IU] via INTRAVENOUS
  Filled 2015-12-04: qty 4000

## 2015-12-04 MED ORDER — ALPRAZOLAM 0.5 MG PO TABS: 1.0000 mg | ORAL_TABLET | Freq: Two times a day (BID) | ORAL | Status: DC | PRN

## 2015-12-04 MED ORDER — IOPAMIDOL (ISOVUE-370) INJECTION 76%
INTRAVENOUS | Status: AC
  Administered 2015-12-04: 100 mL
  Filled 2015-12-04: qty 100

## 2015-12-04 MED ORDER — SODIUM CHLORIDE 0.9% FLUSH: 3.0000 mL | INTRAVENOUS | Status: DC | PRN

## 2015-12-04 MED ORDER — MIDAZOLAM HCL 2 MG/2ML IJ SOLN
INTRAMUSCULAR | Status: AC
  Filled 2015-12-04: qty 2

## 2015-12-04 MED ORDER — FENTANYL CITRATE (PF) 100 MCG/2ML IJ SOLN
INTRAMUSCULAR | Status: AC
  Filled 2015-12-04: qty 2

## 2015-12-04 SURGICAL SUPPLY — 14 items
CATH OPTICROSS 40MHZ (CATHETERS) ×3 IMPLANT
CATH OPTITORQUE TIG 4.0 5F (CATHETERS) ×3 IMPLANT
CATH VISTA GUIDE 6FR XB3.5 (CATHETERS) ×3 IMPLANT
GLIDESHEATH SLEND A-KIT 6F 20G (SHEATH) ×3 IMPLANT
KIT ESSENTIALS PG (KITS) ×3 IMPLANT
KIT HEART LEFT (KITS) ×3 IMPLANT
PACK CARDIAC CATHETERIZATION (CUSTOM PROCEDURE TRAY) ×3 IMPLANT
SLED PULL BACK IVUS (MISCELLANEOUS) ×3 IMPLANT
TRANSDUCER W/STOPCOCK (MISCELLANEOUS) ×3 IMPLANT
TUBING ART PRESS 72  MALE/FEM (TUBING)
TUBING ART PRESS 72 MALE/FEM (TUBING) IMPLANT
TUBING CIL FLEX 10 FLL-RA (TUBING) ×3 IMPLANT
WIRE COUGAR XT STRL 190CM (WIRE) ×3 IMPLANT
WIRE SAFE-T 1.5MM-J .035X260CM (WIRE) ×6 IMPLANT

## 2015-12-04 NOTE — ED Notes (Signed)
Pt coming form home.    Pt arriving to ED with CPAP on. Pt has a 20 LFA. Pt woke up from his sleep with SOB and states that "I just could not catch my breathe." Pt was tripoding, SOB, and diaphoretic upon EMS arrival. Pt lung sounds were tight and heavy. Emesis x1.  Pt ST with multifocal PVCs on EMS monitor.   Pt attempted to give himself a nebulizer treatment with no relief. Pt given 10 mg albuterol, 1 Atrovent, 2 NTG, and 125 of solumedrol from EMS.Per EMS rhales heard in lower fields.  BP 182/105 96% with CPAP 92 HR 18-20R

## 2015-12-04 NOTE — Progress Notes (Signed)
ANTICOAGULATION CONSULT NOTE - Initial Consult  Pharmacy Consult for heparin Indication: chest pain/ACS  No Known Allergies  Patient Measurements: Height: 6' (182.9 cm) Weight: 190 lb (86.183 kg) IBW/kg (Calculated) : 77.6 Heparin Dosing Weight: 86.2 kg  Vital Signs: Temp Source: Oral (05/16 0416) BP: 134/79 mmHg (05/16 1030) Pulse Rate: 75 (05/16 1030)  Labs:  Recent Labs  12/04/15 0410 12/04/15 0421 12/04/15 1005  HGB 13.2 15.0  --   HCT 43.1 44.0  --   PLT 211  --   --   CREATININE  --  1.00  --   TROPONINI  --   --  0.29*    Estimated Creatinine Clearance: 61.4 mL/min (by C-G formula based on Cr of 1).   Medical History: Past Medical History  Diagnosis Date  . Kidney stones   . Hypertension   . Diverticulosis   . Flu   . Cancer [facility])     Assessment: 71 yom with CP on admit. Pharmacy consulted to dose heparin for ACS. No AC pta. CBC wnl, no bleed documented. No PE on CTa.   Goal of Therapy:  Heparin level 0.3-0.7 units/ml Monitor platelets by anticoagulation protocol: Yes   Plan:  Heparin 4000 unit bolus Start heparin at 1150 units/h 8h HL Daily HL/CBC Mon s/sx bleeding   Elicia Lamp, PharmD, [facility] Clinical Pharmacist Pager [phone] 12/04/2015 11:16 AM

## 2015-12-04 NOTE — H&P (Signed)
Robert HAMLYN is an 82 y.o. male.   Chief Complaint: Shortness of breath HPI: Robert Curry  is a 82 y.o. male with a history of HTN, well controlled on losartan, mixed hyperlipidemia (per review of medical records, not on therapy), and remote history of 30 pack year history of smoking, quit 20 years ago. He was recently seen by his PCP for intermittent episodes of shortness of breath and a sensation that he was "wheezing or his airway was constricted". He was given an inhaler without relief in symptoms. He was then scheduled for a routine treadmill stress test in our office which revealed frequent ventricular ectopy during exercise with no ischemic changes.   Early this morning, pt was waken from his sleep gasping for breath and felt hot and became diaphoretic. He went outside to see if the cool air would relieve his symptoms, and when he didn't begin to feel better, his wife called EMS.  On EMS arrival, he became nauseous and vomited once. He was given 324mg  ASA and 2 SL Ntg with relief in symptoms. On arrival to ED, cardiac monitor revealed frequent ventricular ectopy with episode of bigeminy.  Initial troponin was negative, however repeat troponin was positive at 0.29. D-dimer also positive, chest CT negative for PE. He is presently asymptomatic without chest pain or SOB. On questioning, also admits to symptoms suggestive of claudication, right calf primarily.  Past Medical History  Diagnosis Date  . Kidney stones   . Hypertension   . Diverticulosis   . Flu   . Cancer [facility]     Past Surgical History  Procedure Laterality Date  . Prostatectomy      History reviewed. No pertinent family history. Social History:  reports that he has quit smoking. He does not have any smokeless tobacco history on file. He reports that he does not drink alcohol or use illicit drugs.  Allergies: No Known Allergies  Review of Systems - History obtained from the patient General ROS: negative for - fatigue,  fever, malaise, weight gain or weight loss Hematological and Lymphatic ROS: negative for - bleeding problems, blood clots or bruising Respiratory ROS: positive for - cough and shortness of breath negative for - hemoptysis Cardiovascular ROS: positive for - chest tightness and claudication negative for - edema or palpitations Gastrointestinal ROS: positive for - nausea and emesis x 1 this morning Neurological ROS: no TIA or stroke symptoms  Blood pressure 142/82, pulse 75, resp. rate 14, height 6' (1.829 m), weight 86.183 kg (190 lb), SpO2 95 %.   General appearance: alert, cooperative and no distress Eyes: negative Neck: no adenopathy, no carotid bruit, no JVD, supple, symmetrical, trachea midline and thyroid not enlarged, symmetric, no tenderness/mass/nodules Resp: clear to auscultation bilaterally Chest wall: no tenderness Cardio: regular rate and rhythm, S1, S2 normal, no murmur, click, rub or gallop GI: soft, non-tender; bowel sounds normal; no masses,  no organomegaly Extremities: extremities normal, atraumatic, no cyanosis or edema Pulses: Right Pulses: FEM: present 2+ and bruit, POP: barely palpable, DP: present 1+, PT: absent Left Pulses: FEM: present 2+ and bruit, POP: barely palpable, DP: present 1+, PT: absent Skin: Skin color, texture, turgor normal. No rashes or lesions Neurologic: Grossly normal  Results for orders placed or performed during the hospital encounter of 12/04/15 (from the past 48 hour(s))  CBC with Differential     Status: None   Collection Time: 12/04/15  4:10 AM  Result Value Ref Range   WBC 9.1 4.0 - 10.5 K/uL  RBC 4.50 4.22 - 5.81 MIL/uL   Hemoglobin 13.2 13.0 - 17.0 g/dL   HCT 45.443.1 09.839.0 - 11.952.0 %   MCV 95.8 78.0 - 100.0 fL   MCH 29.3 26.0 - 34.0 pg   MCHC 30.6 30.0 - 36.0 g/dL   RDW 14.713.3 82.911.5 - 56.215.5 %   Platelets 211 150 - 400 K/uL   Neutrophils Relative % 58 %   Neutro Abs 5.2 1.7 - 7.7 K/uL   Lymphocytes Relative 32 %   Lymphs Abs 2.9 0.7 -  4.0 K/uL   Monocytes Relative 6 %   Monocytes Absolute 0.6 0.1 - 1.0 K/uL   Eosinophils Relative 4 %   Eosinophils Absolute 0.4 0.0 - 0.7 K/uL   Basophils Relative 0 %   Basophils Absolute 0.0 0.0 - 0.1 K/uL  Brain natriuretic peptide     Status: None   Collection Time: 12/04/15  4:10 AM  Result Value Ref Range   B Natriuretic Peptide 51.3 0.0 - 100.0 pg/mL  D-dimer, quantitative     Status: Abnormal   Collection Time: 12/04/15  4:10 AM  Result Value Ref Range   D-Dimer, Quant 2.49 (H) 0.00 - 0.50 ug/mL-FEU    Comment: (NOTE) At the manufacturer cut-off of 0.50 ug/mL FEU, this assay has been documented to exclude PE with a sensitivity and negative predictive value of 97 to 99%.  At this time, this assay has not been approved by the FDA to exclude DVT/VTE. Results should be correlated with clinical presentation.   I-stat troponin, ED     Status: None   Collection Time: 12/04/15  4:19 AM  Result Value Ref Range   Troponin i, poc 0.00 0.00 - 0.08 ng/mL   Comment 3            Comment: Due to the release kinetics of cTnI, a negative result within the first hours of the onset of symptoms does not rule out myocardial infarction with certainty. If myocardial infarction is still suspected, repeat the test at appropriate intervals.   I-Stat venous blood gas, ED     Status: Abnormal   Collection Time: 12/04/15  4:19 AM  Result Value Ref Range   pH, Ven 7.265 7.250 - 7.300   pCO2, Ven 62.0 (H) 45.0 - 50.0 mmHg   pO2, Ven 32.0 31.0 - 45.0 mmHg   Bicarbonate 28.1 (H) 20.0 - 24.0 mEq/L   TCO2 30 0 - 100 mmol/L   O2 Saturation 50.0 %   Patient temperature HIDE    Sample type VENOUS    Comment NOTIFIED PHYSICIAN   I-stat Chem 8, ED     Status: Abnormal   Collection Time: 12/04/15  4:21 AM  Result Value Ref Range   Sodium 145 135 - 145 mmol/L   Potassium 4.0 3.5 - 5.1 mmol/L   Chloride 106 101 - 111 mmol/L   BUN 25 (H) 6 - 20 mg/dL   Creatinine, Ser 1.301.00 0.61 - 1.24 mg/dL    Glucose, Bld 865111 (H) 65 - 99 mg/dL   Calcium, Ion 7.841.21 6.961.13 - 1.30 mmol/L   TCO2 28 0 - 100 mmol/L   Hemoglobin 15.0 13.0 - 17.0 g/dL   HCT 29.544.0 28.439.0 - 13.252.0 %  Troponin I     Status: Abnormal   Collection Time: 12/04/15 10:05 AM  Result Value Ref Range   Troponin I 0.29 (H) <0.031 ng/mL    Comment:        PERSISTENTLY INCREASED TROPONIN VALUES IN THE RANGE OF  0.04-0.49 ng/mL CAN BE SEEN IN:       -UNSTABLE ANGINA       -CONGESTIVE HEART FAILURE       -MYOCARDITIS       -CHEST TRAUMA       -ARRYHTHMIAS       -LATE PRESENTING MYOCARDIAL INFARCTION       -COPD   CLINICAL FOLLOW-UP RECOMMENDED.    Ct Angio Chest Pe W/cm &/or Wo Cm  12/04/2015  CLINICAL DATA:  Shortness of breath, chest pain, diaphoresis, elevated D-dimer EXAM: CT ANGIOGRAPHY CHEST WITH CONTRAST TECHNIQUE: Multidetector CT imaging of the chest was performed using the standard protocol during bolus administration of intravenous contrast. Multiplanar CT image reconstructions and MIPs were obtained to evaluate the vascular anatomy. CONTRAST:  100 cc Isovue COMPARISON:  None. FINDINGS: Mediastinum/Lymph Nodes: Images of the thoracic inlet are unremarkable. Central airways are patent. No aortic aneurysm. Mild atherosclerotic calcifications of thoracic aorta and coronary arteries. Heart size within normal limits. No pericardial effusion. No mediastinal hematoma or adenopathy. There is no hilar adenopathy. The study is of excellent technical quality. No pulmonary embolus is noted. Lungs/Pleura: Images of the lung parenchyma shows no acute infiltrate or pulmonary edema. Mild atelectasis noted bilateral lower lobe posteriorly left greater than right. There is no pneumothorax. Mild posterior atelectasis in lingula. No pleural effusion. Upper abdomen: No adrenal gland mass is noted in visualized upper abdomen. Visualized liver and spleen is unremarkable. Visualized pancreas is unremarkable. Multiple small gallstones are noted within  gallbladder the largest measures 5 mm. Mild lobulated renal contour bilateral visualized upper kidneys. There is no nephrolithiasis. No upper hydronephrosis. Musculoskeletal: No destructive bony lesions are noted. Sagittal images of the thoracic spine shows mild degenerative changes thoracic spine. Sagittal view of the sternum is unremarkable. No rib fractures are identified. Review of the MIP images confirms the above findings. IMPRESSION: 1. No pulmonary embolus is noted. 2. No mediastinal hematoma or adenopathy. 3. No acute infiltrate or pulmonary edema. Mild atelectasis bilateral lower lobe posteriorly left greater than right. Mild atelectasis posterior aspect of the lingula. 4. Degenerative changes thoracic spine. 5. Calcified gallstones are noted within dependent gallbladder the largest measures 5 mm. Electronically Signed   By: Lahoma Crocker M.D.   On: 12/04/2015 08:19   Dg Chest Port 1 View  12/04/2015  CLINICAL DATA:  Shortness of breath, nausea. History of hypertension. EXAM: PORTABLE CHEST 1 VIEW COMPARISON:  08/09/2015 FINDINGS: Shallow inspiration. The heart size and mediastinal contours are within normal limits. Both lungs are clear. The visualized skeletal structures are unremarkable. IMPRESSION: No active disease. Electronically Signed   By: Lucienne Capers M.D.   On: 12/04/2015 04:54    Labs:   Lab Results  Component Value Date   WBC 9.1 12/04/2015   HGB 15.0 12/04/2015   HCT 44.0 12/04/2015   MCV 95.8 12/04/2015   PLT 211 12/04/2015    Recent Labs Lab 12/04/15 0421  NA 145  K 4.0  CL 106  BUN 25*  CREATININE 1.00  GLUCOSE 111*    Lipid Panel  No results found for: CHOL, TRIG, HDL, CHOLHDL, VLDL, LDLCALC  BNP (last 3 results)  Recent Labs  12/04/15 0410  BNP 51.3    HEMOGLOBIN A1C No results found for: HGBA1C, MPG  Cardiac Panel (last 3 results) Troponin (Point of Care Test)  Recent Labs  12/04/15 0948  TROPIPOC 0.12*    Recent Labs  12/04/15 1005   TROPONINI 0.29*   TSH No results for  input(s): TSH in the last 8760 hours.   Outpatient Treadmill exercise stress test 11/16/2015: Indications: Shortness of breath The resting electrocardiogram demonstrated normal sinus rhythm, normal resting conduction, PVC and normal rest repolarization. The stress electrocardiogram was normal. Patient had frequent PVC and V- Couplets at the start of the exercise, which subsided at peak exercise and reappeared in the recovery. No inducible arrhythmias. Patient exercised on Bruce protocol for 4:57 minutes and achieved 100% of Max Predicted HR (Target HR was >85% MPHR) and 6.97 METS. Stress symptoms included fatigue and dyspnea. Normal BP response. Exercise capacity was low.  EKG 12/04/2014: sinus rhythm at a rate of 96bpm, normal axis, left atrial abnormality, PRWP, frequent PVCs. No evidence of ischemia.   (Not in a hospital admission)    Current facility-administered medications:  .  acetaminophen (TYLENOL) tablet 650 mg, 650 mg, Oral, Q4H PRN, Neldon Labella, NP .  Derrill Memo ON 12/05/2015] aspirin EC tablet 81 mg, 81 mg, Oral, Daily, Neldon Labella, NP .  atorvastatin (LIPITOR) tablet 80 mg, 80 mg, Oral, q1800, Neldon Labella, NP .  heparin ADULT infusion 100 units/mL (25000 units/250 mL), 1,150 Units/hr, Intravenous, Continuous, Romona Curls, [facility], Last Rate: 11.5 mL/hr at 12/04/15 1204, 1,150 Units/hr at 12/04/15 1204 .  metoprolol tartrate (LOPRESSOR) tablet 25 mg, 25 mg, Oral, BID, Neldon Labella, NP .  nitroGLYCERIN (NITROSTAT) SL tablet 0.4 mg, 0.4 mg, Sublingual, Q5 Min x 3 PRN, Neldon Labella, NP .  ondansetron [facility]) injection 4 mg, 4 mg, Intravenous, Once, Quintella Reichert, MD, Stopped at 12/04/15 [phone] .  ondansetron (ZOFRAN) injection 4 mg, 4 mg, Intravenous, Q6H PRN, Neldon Labella, NP  Current outpatient prescriptions:  .  losartan (COZAAR) 50 MG tablet, Take 1 tablet by mouth daily., Disp: , Rfl: 4 .  PROAIR HFA  108 (90 Base) MCG/ACT inhaler, Inhale 2 puffs into the lungs as needed. wheezing, Disp: , Rfl: 0 .  naproxen (NAPROSYN) 250 MG tablet, Take 1 tablet (250 mg total) by mouth 2 (two) times daily with a meal., Disp: 14 tablet, Rfl: 0 .  ondansetron (ZOFRAN) 4 MG tablet, Take 1 tablet (4 mg total) by mouth every 8 (eight) hours as needed for nausea or vomiting., Disp: 6 tablet, Rfl: 0 .  oxyCODONE-acetaminophen (PERCOCET/ROXICET) 5-325 MG per tablet, 1 or 2 tabs PO q6h prn pain, Disp: 25 tablet, Rfl: 0 .  triamcinolone cream (KENALOG) 0.1 %, Apply 1 application topically 3 (three) times daily. (Patient not taking: Reported on 10/04/2014), Disp: 15 g, Rfl: 0    Assessment/Plan 1. NSTEMI 2. Frequent PVCs, ventricular bigeminy 3. Shortness of breath/Unstable angina 4. Benign Hypertension 5. History of tobacco use 6. Claudication  Recommendation: Pt's symptoms concerning for ACS, now with positive troponin. Recommend proceeding with coronary angiogram for evaluation of coronary anatomy. Schedule for cardiac catheterization, and possible angioplasty. We discussed regarding risks, benefits, alternatives to this including stress testing, CTA and continued medical therapy. Patient wants to proceed. Understands <1-2% risk of death, stroke, MI, urgent CABG, bleeding, infection, renal failure but not limited to these. Begin heparin gtt. Will add metoprolol and atorvastatin. Further recommendations following catheterization.  Rachel Bo, NP-C 12/04/2015, 11:32 AM [facility]. [place] Pager: [phone] Office: [phone]

## 2015-12-04 NOTE — ED Provider Notes (Signed)
Patient accepted in sign out pending CTA.  CTA without sign of PE or acute process. At the time of sign out my colleague had wanted the patient to be admitted for chest pain rule out if his CTA was normal. When I discussed this with the patient the patient said that he would much prefer to just be discharged home. I initially discussed the case on the phone with his primary care physician Dr. Rex Kras who said he could see the patient in follow-up outpatient. I asked that the patient stay for repeat troponin. Repeat troponin came back elevated. For this reason cardiology was consult it. The patient was seen in the emergency department by [facility] Dr. Irven Shelling nurse practitioner.  They admitted the patient under their care for further treatment and evaluation.  Patient was updated on results and plan of care.  Patient was started on Heparin per cardiology recommendations.  Filed Vitals:   12/04/15 0900 12/04/15 1030  BP: 122/69 134/79  Pulse: 75 75  Resp: 13 13    Physical Exam  Constitutional: No distress.  HENT:  Head: Normocephalic and atraumatic.  Eyes: EOM are normal. Pupils are equal, round, and reactive to light.  Neck: Normal range of motion. Neck supple. No JVD present.  Cardiovascular: Normal rate, normal heart sounds and intact distal pulses.   No murmur heard. Pulmonary/Chest: Effort normal. No respiratory distress. He has no wheezes. He has no rales.  Abdominal: Soft. He exhibits no distension. There is no tenderness.  Musculoskeletal: Normal range of motion. He exhibits no edema.  Neurological: He is alert. He exhibits normal muscle tone.  Skin: Skin is warm and dry. He is not diaphoretic.  Vitals reviewed.   CRITICAL CARE Performed by: Earlie Server   Total critical care time: 35 minutes  Critical care time was exclusive of separately billable procedures and treating other patients.  Critical care was necessary to treat or prevent imminent or life-threatening  deterioration.  Critical care was time spent personally by me on the following activities: development of treatment plan with patient and/or surrogate as well as nursing, discussions with consultants, evaluation of patient's response to treatment, examination of patient, obtaining history from patient or surrogate, ordering and performing treatments and interventions, ordering and review of laboratory studies, ordering and review of radiographic studies, pulse oximetry and re-evaluation of patient's condition.   Harvel Quale, MD 12/04/15 1150

## 2015-12-04 NOTE — ED Provider Notes (Signed)
CSN: [id number]     Arrival date & time 12/04/15  0407 History   First MD Initiated Contact with Patient 12/04/15 0407     Chief Complaint  Patient presents with  . Shortness of Breath      The history is provided by the patient. No language interpreter was used.   Robert Curry is a 82 y.o. male who presents to the Emergency Department complaining of SOB.  [name] awoke suddenly prior to ED arrival with severe shortness of breath. He was awoken from sleep with a sensation that he could not breathe. His wife states that he appeared panicky. He ran outside to get air and asked for 911 to be called. EMS reports the patient was diaphoretic and tripoding. He was given aspirin, nitroglycerin, albuterol, Solu-Medrol and initiated on BiPAP. He reports several weeks of neck pain. Headache started after nitroglycerin. He had some chest pain and tightness associated with shortness of breath, resolved on ED presentation. No fevers, cough, abdominal pain, lower extremity swelling or pain. He does endorse some nausea.  Past Medical History  Diagnosis Date  . Kidney stones   . Hypertension   . Diverticulosis   . Flu   . Cancer [facility])    Past Surgical History  Procedure Laterality Date  . Prostatectomy    . Cataract extraction Left    History reviewed. No pertinent family history. Social History  Substance Use Topics  . Smoking status: Former Research scientist (life sciences)  . Smokeless tobacco: Former Systems developer     Comment: quit smoking in  2001  . Alcohol Use: No    Review of Systems  All other systems reviewed and are negative.     Allergies  Review of patient's allergies indicates no known allergies.  Home Medications   Prior to Admission medications   Medication Sig Start Date End Date Taking? Authorizing Provider  losartan (COZAAR) 50 MG tablet Take 1 tablet by mouth daily. 10/31/15  Yes Historical Provider, MD  PROAIR HFA 108 (90 Base) MCG/ACT inhaler Inhale 2 puffs into the lungs as needed. wheezing  10/31/15  Yes Historical Provider, MD  naproxen (NAPROSYN) 250 MG tablet Take 1 tablet (250 mg total) by mouth 2 (two) times daily with a meal. 10/04/14   Francine Graven, DO  ondansetron (ZOFRAN) 4 MG tablet Take 1 tablet (4 mg total) by mouth every 8 (eight) hours as needed for nausea or vomiting. 10/04/14   Francine Graven, DO  oxyCODONE-acetaminophen (PERCOCET/ROXICET) 5-325 MG per tablet 1 or 2 tabs PO q6h prn pain 10/04/14   Francine Graven, DO  triamcinolone cream (KENALOG) 0.1 % Apply 1 application topically 3 (three) times daily. Patient not taking: Reported on 10/04/2014 09/25/14   Posey Boyer, MD   BP 121/61 mmHg  Pulse 77  Temp(Src) 98.3 F (36.8 C) (Oral)  Resp 18  Ht 6\' 1"  (1.854 m)  Wt 185 lb 13.6 oz (84.3 kg)  BMI 24.52 kg/m2  SpO2 95% Physical Exam  Constitutional: He is oriented to person, place, and time. He appears well-developed and well-nourished. He appears distressed.  HENT:  Head: Normocephalic and atraumatic.  Cardiovascular: Normal rate and regular rhythm.   No murmur heard. Pulmonary/Chest: Effort normal. No respiratory distress.  Decreased air movement bilaterally with crackles in the left lung base  Abdominal: Soft. There is no tenderness. There is no rebound and no guarding.  Musculoskeletal: He exhibits no tenderness.  1+ pitting edema in BLE  Neurological: He is alert and oriented to person, place,  and time.  Skin: Skin is warm and dry.  Psychiatric: He has a normal mood and affect. His behavior is normal.  Nursing note and vitals reviewed.   ED Course  Procedures (including critical care time) Labs Review Labs Reviewed  D-DIMER, QUANTITATIVE (NOT AT [facility]) - Abnormal; Notable for the following:    D-Dimer, Quant 2.49 (*)    All other components within normal limits  TROPONIN I - Abnormal; Notable for the following:    Troponin I 0.29 (*)    All other components within normal limits  TROPONIN I - Abnormal; Notable for the following:     Troponin I 0.33 (*)    All other components within normal limits  TROPONIN I - Abnormal; Notable for the following:    Troponin I 0.41 (*)    All other components within normal limits  LIPID PANEL - Abnormal; Notable for the following:    LDL Cholesterol 117 (*)    All other components within normal limits  I-STAT CHEM 8, ED - Abnormal; Notable for the following:    BUN 25 (*)    Glucose, Bld 111 (*)    All other components within normal limits  I-STAT VENOUS BLOOD GAS, ED - Abnormal; Notable for the following:    pCO2, Ven 62.0 (*)    Bicarbonate 28.1 (*)    All other components within normal limits  I-STAT TROPOININ, ED - Abnormal; Notable for the following:    Troponin i, poc 0.12 (*)    All other components within normal limits  CBC WITH DIFFERENTIAL/PLATELET  BRAIN NATRIURETIC PEPTIDE  PROTIME-INR  TROPONIN I  BASIC METABOLIC PANEL  CBC  I-STAT TROPOININ, ED  POCT ACTIVATED CLOTTING TIME    Imaging Review Ct Angio Chest Pe W/cm &/or Wo Cm  12/04/2015  CLINICAL DATA:  Shortness of breath, chest pain, diaphoresis, elevated D-dimer EXAM: CT ANGIOGRAPHY CHEST WITH CONTRAST TECHNIQUE: Multidetector CT imaging of the chest was performed using the standard protocol during bolus administration of intravenous contrast. Multiplanar CT image reconstructions and MIPs were obtained to evaluate the vascular anatomy. CONTRAST:  100 cc Isovue COMPARISON:  None. FINDINGS: Mediastinum/Lymph Nodes: Images of the thoracic inlet are unremarkable. Central airways are patent. No aortic aneurysm. Mild atherosclerotic calcifications of thoracic aorta and coronary arteries. Heart size within normal limits. No pericardial effusion. No mediastinal hematoma or adenopathy. There is no hilar adenopathy. The study is of excellent technical quality. No pulmonary embolus is noted. Lungs/Pleura: Images of the lung parenchyma shows no acute infiltrate or pulmonary edema. Mild atelectasis noted bilateral lower lobe  posteriorly left greater than right. There is no pneumothorax. Mild posterior atelectasis in lingula. No pleural effusion. Upper abdomen: No adrenal gland mass is noted in visualized upper abdomen. Visualized liver and spleen is unremarkable. Visualized pancreas is unremarkable. Multiple small gallstones are noted within gallbladder the largest measures 5 mm. Mild lobulated renal contour bilateral visualized upper kidneys. There is no nephrolithiasis. No upper hydronephrosis. Musculoskeletal: No destructive bony lesions are noted. Sagittal images of the thoracic spine shows mild degenerative changes thoracic spine. Sagittal view of the sternum is unremarkable. No rib fractures are identified. Review of the MIP images confirms the above findings. IMPRESSION: 1. No pulmonary embolus is noted. 2. No mediastinal hematoma or adenopathy. 3. No acute infiltrate or pulmonary edema. Mild atelectasis bilateral lower lobe posteriorly left greater than right. Mild atelectasis posterior aspect of the lingula. 4. Degenerative changes thoracic spine. 5. Calcified gallstones are noted within dependent gallbladder the largest  measures 5 mm. Electronically Signed   By: Lahoma Crocker M.D.   On: 12/04/2015 08:19   Dg Chest Port 1 View  12/04/2015  CLINICAL DATA:  Shortness of breath, nausea. History of hypertension. EXAM: PORTABLE CHEST 1 VIEW COMPARISON:  08/09/2015 FINDINGS: Shallow inspiration. The heart size and mediastinal contours are within normal limits. Both lungs are clear. The visualized skeletal structures are unremarkable. IMPRESSION: No active disease. Electronically Signed   By: Lucienne Capers M.D.   On: 12/04/2015 04:54   I have personally reviewed and evaluated these images and lab results as part of my medical decision-making.   EKG Interpretation   Date/Time:  Tuesday Dec 04 2015 04:05:18 EDT Ventricular Rate:  99 PR Interval:  171 QRS Duration: 93 QT Interval:  369 QTC Calculation: 473 R Axis:    62 Text Interpretation:  Age not entered, assumed to be  82 years old for  purpose of ECG interpretation Sinus tachycardia Multiple ventricular  premature complexes Confirmed by Hazle Coca [phone]) on 12/04/2015 6:40:09 AM  Also confirmed by Hazle Coca [phone]), editor Yehuda Mao [phone])  on  12/04/2015 10:11:15 AM      MDM   Final diagnoses:  Dyspnea    Patient here for evaluation of sudden onset shortness of breath. Per EMS report he was extremity is hard to ED arrival. On ED arrival he is significantly improved and he was removed from the CPAP.  He does endorse ongoing shortness of breath but it is improved compared to earlier.  Plan to check CT PE study to rule out PE given the patient's dyspnea. Patient care transferred pending CT.  Quintella Reichert, MD 12/04/15 2229

## 2015-12-04 NOTE — Interval H&P Note (Signed)
History and Physical Interval Note:  12/04/2015 4:27 PM  Robert Curry  has presented today for surgery, with the diagnosis of unstable angina  The various methods of treatment have been discussed with the patient and family. After consideration of risks, benefits and other options for treatment, the patient has consented to  Procedure(s): Left Heart Cath and Coronary Angiography (N/A) and possible PCI as a surgical intervention .  The patient's history has been reviewed, patient examined, no change in status, stable for surgery.  I have reviewed the patient's chart and labs.  Questions were answered to the patient's satisfaction.   Cath Lab Visit (complete for each Cath Lab visit)  Clinical Evaluation Leading to the Procedure:   ACS: Yes.    Non-ACS:    Anginal Classification: CCS IV  Anti-ischemic medical therapy: No Therapy  Non-Invasive Test Results: No non-invasive testing performed  Prior CABG: No previous CABG        Adrian Prows

## 2015-12-05 ENCOUNTER — Encounter (HOSPITAL_COMMUNITY): Payer: Self-pay | Admitting: Cardiology

## 2015-12-05 ENCOUNTER — Other Ambulatory Visit (HOSPITAL_COMMUNITY): Payer: Medicare Other

## 2015-12-05 DIAGNOSIS — I700 Atherosclerosis of aorta: Secondary | ICD-10-CM | POA: Diagnosis present

## 2015-12-05 DIAGNOSIS — Z87891 Personal history of nicotine dependence: Secondary | ICD-10-CM | POA: Diagnosis not present

## 2015-12-05 DIAGNOSIS — R0600 Dyspnea, unspecified: Secondary | ICD-10-CM | POA: Diagnosis present

## 2015-12-05 DIAGNOSIS — E782 Mixed hyperlipidemia: Secondary | ICD-10-CM | POA: Diagnosis present

## 2015-12-05 DIAGNOSIS — R0902 Hypoxemia: Secondary | ICD-10-CM | POA: Diagnosis present

## 2015-12-05 DIAGNOSIS — I2584 Coronary atherosclerosis due to calcified coronary lesion: Secondary | ICD-10-CM | POA: Diagnosis present

## 2015-12-05 DIAGNOSIS — Z859 Personal history of malignant neoplasm, unspecified: Secondary | ICD-10-CM | POA: Diagnosis not present

## 2015-12-05 DIAGNOSIS — I248 Other forms of acute ischemic heart disease: Secondary | ICD-10-CM | POA: Diagnosis present

## 2015-12-05 DIAGNOSIS — I2510 Atherosclerotic heart disease of native coronary artery without angina pectoris: Secondary | ICD-10-CM | POA: Diagnosis present

## 2015-12-05 DIAGNOSIS — I10 Essential (primary) hypertension: Secondary | ICD-10-CM | POA: Diagnosis present

## 2015-12-05 LAB — CBC
HCT: 36.9 % — ABNORMAL LOW (ref 39.0–52.0)
Hemoglobin: 11.9 g/dL — ABNORMAL LOW (ref 13.0–17.0)
MCH: 30.1 pg (ref 26.0–34.0)
MCHC: 32.2 g/dL (ref 30.0–36.0)
MCV: 93.2 fL (ref 78.0–100.0)
PLATELETS: 203 10*3/uL (ref 150–400)
RBC: 3.96 MIL/uL — AB (ref 4.22–5.81)
RDW: 13.2 % (ref 11.5–15.5)
WBC: 9.9 10*3/uL (ref 4.0–10.5)

## 2015-12-05 LAB — BASIC METABOLIC PANEL
ANION GAP: 8 (ref 5–15)
BUN: 17 mg/dL (ref 6–20)
CO2: 24 mmol/L (ref 22–32)
Calcium: 8.2 mg/dL — ABNORMAL LOW (ref 8.9–10.3)
Chloride: 109 mmol/L (ref 101–111)
Creatinine, Ser: 0.91 mg/dL (ref 0.61–1.24)
GFR calc Af Amer: >60 mL/min (ref >60)
Glucose, Bld: 114 mg/dL — ABNORMAL HIGH (ref 65–99)
POTASSIUM: 4 mmol/L (ref 3.5–5.1)
SODIUM: 141 mmol/L (ref 135–145)

## 2015-12-05 LAB — TROPONIN I: Troponin I: 0.45 ng/mL — ABNORMAL HIGH (ref <0.031)

## 2015-12-05 MED ORDER — ASPIRIN 81 MG PO CHEW
81.0000 mg | CHEWABLE_TABLET | Freq: Every day | ORAL | Status: DC
  Administered 2015-12-05: 81 mg via ORAL
  Filled 2015-12-05: qty 1

## 2015-12-05 MED ORDER — ASPIRIN 81 MG PO CHEW: 81.0000 mg | CHEWABLE_TABLET | Freq: Every day | ORAL | Status: DC

## 2015-12-05 MED ORDER — ATORVASTATIN CALCIUM 80 MG PO TABS: 80.0000 mg | ORAL_TABLET | Freq: Every day | ORAL | Status: AC

## 2015-12-05 MED ORDER — ATORVASTATIN CALCIUM 10 MG PO TABS: 10.0000 mg | ORAL_TABLET | Freq: Every day | ORAL | Status: DC

## 2015-12-05 NOTE — Care Management Obs Status (Signed)
[facility] NOTIFICATION   Patient Details  Name: JIBREEL LORIG MRN: [id number] Date of Birth: 04/16/1930   Medicare Observation Status Notification Given:  Yes    Dawayne Patricia, RN 12/05/2015, 10:42 AM

## 2015-12-05 NOTE — Progress Notes (Signed)
Utilization review completed.  

## 2015-12-05 NOTE — Discharge Summary (Signed)
Physician Discharge Summary  Patient ID: Robert Curry MRN: [id number] DOB/AGE: February 15, 1930 82 y.o.  Admit date: 12/04/2015 Discharge date: 12/05/2015  Primary Discharge Diagnosis 1. Acute onset shortness of breath and dyspnea on exertion, suspect acute airway disease. Patient exposed to wood burning stove on a regular basis. BNP on presentation was normal, chest x-ray did not reveal any acute pulmonary edema. 2. Secondary troponin leak due to demand ischemia from hypoxemia. 3. Hypertension 4. Hyperlipidemia 5. Moderate coronary artery disease involving the left main and LAD.   Significant Diagnostic Studies: 12/04/2015: Procedures    Intravascular Ultrasound/IVUS   Left Heart Cath and Coronary Angiography    Conclusion    1. Grossly normal LV systolic function. 2. Moderate diffuse coronary artery disease with coronary calcification involving the left coronary artery. Left main has a 60% stenosis at most by IVUS and lumen 80 of 7.35 mm. There was severe calcification of the left main involving about 50% of the circumference. Eccentric ossification. Diagonal 1 at the ostium has a calcific 50% stenosis.  3. LAD with mild diffuse disease, scattered 30-40% stenosis. 4. Superdominant right coronary artery, smooth and normal.    Hospital Course: Patient admitted on 12/04/2015 very early in the morning, with acute onset shortness of breath. Cardiac troponin was negative on admission, but raised to 0.3 and eventually to 0.4, suspicion for coronary artery disease and ACS. D-dimer was positive, CT angiogram of the chest revealed no evidence of pulmonary embolus some and no other significant disease was evident. Asymptomatic gallstones were present. Renal stones also present. He underwent coronary angiography which revealed moderate CAD.  Patient completely asymptomatic following hospital admission. No EKG abnormality. Suspected that his presentation may be more consistent with acute  bronchospasm and reactive airway disease. Hence felt stable for discharge with outpatient follow-up and arrangements made to have pulmonary consultation in the outpatient basis. He will need echocardiogram and outpatient basis. He'll be discharged home on aspirin, statins haven't started due to CAD and also probable PAD and aortic atherosclerosis.  Discharge Exam: Blood pressure 124/61, pulse 55, temperature 98 F (36.7 C), temperature source Oral, resp. rate 19, height 6\' 1"  (1.854 m), weight 85 kg (187 lb 6.3 oz), SpO2 96 %.  General appearance: alert, cooperative and no distress Eyes: negative Neck: no adenopathy, no carotid bruit, no JVD, supple, symmetrical, trachea midline and thyroid not enlarged, symmetric, no tenderness/mass/nodules Resp: clear to auscultation bilaterally Chest wall: no tenderness Cardio: regular rate and rhythm, S1, S2 normal, no murmur, click, rub or gallop GI: soft, non-tender; bowel sounds normal; no masses, no organomegaly Extremities: extremities normal, atraumatic, no cyanosis or edema Pulses: Right Pulses: FEM: present 2+ and bruit, POP: barely palpable, DP: present 1+, PT: absent Left Pulses: FEM: present 2+ and bruit, POP: barely palpable, DP: present 1+, PT: absent Skin: Skin color, texture, turgor normal. No rashes or lesions  Labs:   Lab Results  Component Value Date   WBC 9.9 12/05/2015   HGB 11.9* 12/05/2015   HCT 36.9* 12/05/2015   MCV 93.2 12/05/2015   PLT 203 12/05/2015    Recent Labs Lab 12/05/15 0202  NA 141  K 4.0  CL 109  CO2 24  BUN 17  CREATININE 0.91  CALCIUM 8.2*  GLUCOSE 114*    Lipid Panel     Component Value Date/Time   CHOL 178 12/04/2015 1418   TRIG 24 12/04/2015 1418   HDL 56 12/04/2015 1418   CHOLHDL 3.2 12/04/2015 1418   VLDL 5 12/04/2015 1418  LDLCALC 117* 12/04/2015 1418    Recent Labs  12/04/15 0410  BNP 51.3    Recent Labs  12/04/15 1418 12/04/15 1944 12/05/15 0202  TROPONINI 0.33* 0.41*  0.45*   EKG: normal EKG, normal sinus rhythm, unchanged from previous tracings.   Radiology: Ct Angio Chest Pe W/cm &/or Wo Cm  12/04/2015  CLINICAL DATA:  Shortness of breath, chest pain, diaphoresis, elevated D-dimer EXAM: CT ANGIOGRAPHY CHEST WITH CONTRAST TECHNIQUE: Multidetector CT imaging of the chest was performed using the standard protocol during bolus administration of intravenous contrast. Multiplanar CT image reconstructions and MIPs were obtained to evaluate the vascular anatomy. CONTRAST:  100 cc Isovue COMPARISON:  None. FINDINGS: Mediastinum/Lymph Nodes: Images of the thoracic inlet are unremarkable. Central airways are patent. No aortic aneurysm. Mild atherosclerotic calcifications of thoracic aorta and coronary arteries. Heart size within normal limits. No pericardial effusion. No mediastinal hematoma or adenopathy. There is no hilar adenopathy. The study is of excellent technical quality. No pulmonary embolus is noted. Lungs/Pleura: Images of the lung parenchyma shows no acute infiltrate or pulmonary edema. Mild atelectasis noted bilateral lower lobe posteriorly left greater than right. There is no pneumothorax. Mild posterior atelectasis in lingula. No pleural effusion. Upper abdomen: No adrenal gland mass is noted in visualized upper abdomen. Visualized liver and spleen is unremarkable. Visualized pancreas is unremarkable. Multiple small gallstones are noted within gallbladder the largest measures 5 mm. Mild lobulated renal contour bilateral visualized upper kidneys. There is no nephrolithiasis. No upper hydronephrosis. Musculoskeletal: No destructive bony lesions are noted. Sagittal images of the thoracic spine shows mild degenerative changes thoracic spine. Sagittal view of the sternum is unremarkable. No rib fractures are identified. Review of the MIP images confirms the above findings. IMPRESSION: 1. No pulmonary embolus is noted. 2. No mediastinal hematoma or adenopathy. 3. No acute  infiltrate or pulmonary edema. Mild atelectasis bilateral lower lobe posteriorly left greater than right. Mild atelectasis posterior aspect of the lingula. 4. Degenerative changes thoracic spine. 5. Calcified gallstones are noted within dependent gallbladder the largest measures 5 mm. Electronically Signed   By: Lahoma Crocker M.D.   On: 12/04/2015 08:19   Dg Chest Port 1 View  12/04/2015  CLINICAL DATA:  Shortness of breath, nausea. History of hypertension. EXAM: PORTABLE CHEST 1 VIEW COMPARISON:  08/09/2015 FINDINGS: Shallow inspiration. The heart size and mediastinal contours are within normal limits. Both lungs are clear. The visualized skeletal structures are unremarkable. IMPRESSION: No active disease. Electronically Signed   By: Lucienne Capers M.D.   On: 12/04/2015 04:54   FOLLOW UP PLANS AND APPOINTMENTS    Medication List    STOP taking these medications        naproxen 250 MG tablet  Commonly known as:  NAPROSYN     ondansetron 4 MG tablet  Commonly known as:  ZOFRAN     oxyCODONE-acetaminophen 5-325 MG tablet  Commonly known as:  PERCOCET/ROXICET     triamcinolone cream 0.1 %  Commonly known as:  KENALOG      TAKE these medications        aspirin 81 MG chewable tablet  Chew 1 tablet (81 mg total) by mouth daily.     atorvastatin 80 MG tablet  Commonly known as:  LIPITOR  Take 1 tablet (80 mg total) by mouth daily at 6 PM.     losartan 50 MG tablet  Commonly known as:  COZAAR  Take 1 tablet by mouth daily.     PROAIR HFA 108 (90  Base) MCG/ACT inhaler  Generic drug:  albuterol  Inhale 2 puffs into the lungs as needed. wheezing       Follow-up Information    Follow up with Adrian Prows, MD.   Specialty:  Cardiology   Why:  We will call you to set up appointment to see [place] in 2 weeks and also set up [place] (lung) doctor visit   Contact information:   [place] [place] [address] [phone]       Follow up with Tamsen Roers, MD.    Specialty:  Family Medicine   Why:  To see him in 2 weeks   Contact information:   [address] [place] [address] [phone]        Adrian Prows, MD 12/05/2015, 9:05 AM  Pager: [phone] Office: [phone] If no answer: [phone]

## 2015-12-05 NOTE — Progress Notes (Signed)
Pt discharged to home with family (wife and daughter) at bedside.  Vitals stable.  Denies pain.  Discharge instructions discussed with patient.  Verbalized understanding.  Pt has all belongings.  No further questions or concerns.  R radial site clean, dry, intact, level 0 with gauze and tegaderm in place.  Iantha Fallen RN 12/05/2015 1210

## 2016-02-05 ENCOUNTER — Institutional Professional Consult (permissible substitution): Payer: Medicare Other | Admitting: Internal Medicine

## 2016-03-27 ENCOUNTER — Encounter (INDEPENDENT_AMBULATORY_CARE_PROVIDER_SITE_OTHER): Payer: Medicare Other | Admitting: Ophthalmology

## 2016-07-22 ENCOUNTER — Encounter (HOSPITAL_COMMUNITY): Payer: Self-pay

## 2016-07-22 ENCOUNTER — Emergency Department (HOSPITAL_COMMUNITY): Payer: Medicare Other

## 2016-07-22 ENCOUNTER — Emergency Department (HOSPITAL_COMMUNITY)
Admission: EM | Admit: 2016-07-22 | Discharge: 2016-07-22 | Disposition: A | Discharge status: Home / Self Care | Payer: Medicare Other | Attending: Emergency Medicine | Admitting: Emergency Medicine

## 2016-07-22 DIAGNOSIS — R059 Cough, unspecified: Secondary | ICD-10-CM

## 2016-07-22 DIAGNOSIS — R0602 Shortness of breath: Secondary | ICD-10-CM | POA: Insufficient documentation

## 2016-07-22 DIAGNOSIS — I10 Essential (primary) hypertension: Secondary | ICD-10-CM | POA: Diagnosis not present

## 2016-07-22 DIAGNOSIS — R600 Localized edema: Secondary | ICD-10-CM | POA: Diagnosis not present

## 2016-07-22 DIAGNOSIS — I252 Old myocardial infarction: Secondary | ICD-10-CM | POA: Insufficient documentation

## 2016-07-22 DIAGNOSIS — R05 Cough: Secondary | ICD-10-CM | POA: Diagnosis present

## 2016-07-22 DIAGNOSIS — J45909 Unspecified asthma, uncomplicated: Secondary | ICD-10-CM | POA: Insufficient documentation

## 2016-07-22 DIAGNOSIS — Z87891 Personal history of nicotine dependence: Secondary | ICD-10-CM | POA: Insufficient documentation

## 2016-07-22 DIAGNOSIS — Z7982 Long term (current) use of aspirin: Secondary | ICD-10-CM | POA: Diagnosis not present

## 2016-07-22 HISTORY — DX: Unspecified asthma, uncomplicated: J45.909

## 2016-07-22 LAB — BASIC METABOLIC PANEL
Anion gap: 8 (ref 5–15)
BUN: 9 mg/dL (ref 6–20)
CO2: 25 mmol/L (ref 22–32)
CREATININE: 0.95 mg/dL (ref 0.61–1.24)
Calcium: 9 mg/dL (ref 8.9–10.3)
Chloride: 106 mmol/L (ref 101–111)
GFR calc Af Amer: >60 mL/min (ref >60)
GLUCOSE: 100 mg/dL — AB (ref 65–99)
Potassium: 3.9 mmol/L (ref 3.5–5.1)
Sodium: 139 mmol/L (ref 135–145)

## 2016-07-22 LAB — CBC
HCT: 43.4 % (ref 39.0–52.0)
Hemoglobin: 14.3 g/dL (ref 13.0–17.0)
MCH: 31 pg (ref 26.0–34.0)
MCHC: 32.9 g/dL (ref 30.0–36.0)
MCV: 93.9 fL (ref 78.0–100.0)
Platelets: 211 10*3/uL (ref 150–400)
RBC: 4.62 MIL/uL (ref 4.22–5.81)
RDW: 13.5 % (ref 11.5–15.5)
WBC: 6.7 10*3/uL (ref 4.0–10.5)

## 2016-07-22 LAB — BRAIN NATRIURETIC PEPTIDE: B Natriuretic Peptide: 84.5 pg/mL (ref 0.0–100.0)

## 2016-07-22 LAB — I-STAT TROPONIN, ED: TROPONIN I, POC: 0 ng/mL (ref 0.00–0.08)

## 2016-07-22 MED ORDER — IPRATROPIUM-ALBUTEROL 0.5-2.5 (3) MG/3ML IN SOLN
3.0000 mL | Freq: Once | RESPIRATORY_TRACT | Status: AC
  Administered 2016-07-22: 3 mL via RESPIRATORY_TRACT
  Filled 2016-07-22: qty 3

## 2016-07-22 MED ORDER — AEROCHAMBER PLUS FLO-VU MEDIUM MISC
1.0000 | Freq: Once | Status: AC
  Administered 2016-07-22: 1
  Filled 2016-07-22: qty 1

## 2016-07-22 MED ORDER — PREDNISONE 50 MG PO TABS: 50.0000 mg | ORAL_TABLET | Freq: Every day | ORAL | 0 refills | Status: AC

## 2016-07-22 MED ORDER — PREDNISONE 20 MG PO TABS
60.0000 mg | ORAL_TABLET | Freq: Once | ORAL | Status: AC
  Administered 2016-07-22: 60 mg via ORAL
  Filled 2016-07-22: qty 3

## 2016-07-22 MED ORDER — ALBUTEROL SULFATE HFA 108 (90 BASE) MCG/ACT IN AERS
2.0000 | INHALATION_SPRAY | Freq: Once | RESPIRATORY_TRACT | Status: AC
  Administered 2016-07-22: 2 via RESPIRATORY_TRACT
  Filled 2016-07-22: qty 6.7

## 2016-07-22 NOTE — ED Provider Notes (Signed)
[facility] DEPT Provider Note   CSN: [id number] Arrival date & time: 07/22/16  1122     History   Chief Complaint Chief Complaint  Patient presents with  . Cough  . Shortness of Breath  . Chest Pain    HPI Robert Curry is a 83 y.o. male.  The history is provided by the patient, the spouse and medical records. No language interpreter was used.     This is an 83 year old male the past medical history of chronic cough and shortness of breath who presents today with worsening shortness of breath and dyspnea on exertion. Patient states worsened over the last few days. States he's attributed this to an upper respiratory infection that he's had recently and sinus drainage associated with this. Denies any fevers, chills, nausea, vomiting, diarrhea, chest pain. Exertion makes his symptoms worse. States that his symptoms are alleviated when he uses his albuterol inhaler that was prescribed to him by his primary doctor. States that he was supposed to see his primary doctor today but the office had closed due to type issues from the cold weather. States that his symptoms are severe enough that he felt he still needed to be seen.  Past Medical History:  Diagnosis Date  . Asthma   . Cancer ([place])   . Diverticulosis   . Flu   . Hypertension   . Kidney stones     Patient Active Problem List   Diagnosis Date Noted  . NSTEMI (non-ST elevated myocardial infarction) ([place]) 12/04/2015    Past Surgical History:  Procedure Laterality Date  . CARDIAC CATHETERIZATION N/A 12/04/2015   Procedure: Left Heart Cath and Coronary Angiography;  Surgeon: Adrian Prows, MD;  Location: [facility];  Service: Cardiovascular;  Laterality: N/A;  . CARDIAC CATHETERIZATION N/A 12/04/2015   Procedure: Intravascular Ultrasound/IVUS;  Surgeon: Adrian Prows, MD;  Location: [facility];  Service: Cardiovascular;  Laterality: N/A;  . CATARACT EXTRACTION Left   . PROSTATECTOMY         Home Medications      Prior to Admission medications   Medication Sig Start Date End Date Taking? Authorizing Provider  aspirin 81 MG chewable tablet Chew 1 tablet (81 mg total) by mouth daily. 12/05/15  Yes Adrian Prows, MD  atorvastatin (LIPITOR) 80 MG tablet Take 1 tablet (80 mg total) by mouth daily at 6 PM. 12/05/15  Yes Adrian Prows, MD  losartan (COZAAR) 50 MG tablet Take 1 tablet by mouth daily. 10/31/15  Yes Historical Provider, MD  PROAIR HFA 108 (90 Base) MCG/ACT inhaler Inhale 2 puffs into the lungs every 4 (four) hours as needed for shortness of breath. wheezing 10/31/15  Yes Historical Provider, MD  predniSONE (DELTASONE) 50 MG tablet Take 1 tablet (50 mg total) by mouth daily with breakfast. 07/22/16 07/26/16  Theodosia Quay, MD    Family History No family history on file.  Social History Social History  Substance Use Topics  . Smoking status: Former Research scientist (life sciences)  . Smokeless tobacco: Former Systems developer     Comment: quit smoking in  2001  . Alcohol use No     Allergies   Patient has no known allergies.   Review of Systems Review of Systems  Constitutional: Positive for fatigue (with exertion). Negative for chills and fever.  HENT: Positive for congestion. Negative for ear pain and sore throat.   Respiratory: Positive for cough, shortness of breath and wheezing.   Cardiovascular: Negative for chest pain and palpitations.  Gastrointestinal: Negative for  abdominal pain and vomiting.  Genitourinary: Negative for dysuria and hematuria.  Musculoskeletal: Negative for arthralgias and back pain.  Skin: Negative for color change and rash.  Neurological: Negative for seizures and syncope.  All other systems reviewed and are negative.    Physical Exam Updated Vital Signs BP 151/75   Pulse 104   Temp 98.8 F (37.1 C) (Oral)   Resp 14   Ht 6\' 1"  (1.854 m)   Wt 84.8 kg   SpO2 91%   BMI 24.67 kg/m   Physical Exam  Constitutional: No distress.  Appears stated age  HENT:  Head: Normocephalic and atraumatic.   Eyes: Conjunctivae and EOM are normal.  Neck: Neck supple.  Cardiovascular: Normal rate and regular rhythm.   No murmur heard. Pulmonary/Chest: Effort normal. No respiratory distress. He has wheezes (diffuse expiratory wheezing).  Abdominal: Soft. He exhibits no distension. There is no tenderness.  Musculoskeletal: He exhibits edema (mild pitting edema in bilateral lower extremities).  Neurological: He is alert.  Skin: Skin is warm and dry. He is not diaphoretic.  Psychiatric: He has a normal mood and affect.  Nursing note and vitals reviewed.    ED Treatments / Results  Labs (all labs ordered are listed, but only abnormal results are displayed) Labs Reviewed  BASIC METABOLIC PANEL - Abnormal; Notable for the following:       Result Value   Glucose, Bld 100 (*)    All other components within normal limits  CBC  BRAIN NATRIURETIC PEPTIDE  I-STAT TROPOININ, ED    EKG  EKG Interpretation  Date/Time:  Tuesday July 22 2016 11:34:44 EST Ventricular Rate:  85 PR Interval:  154 QRS Duration: 86 QT Interval:  350 QTC Calculation: 416 R Axis:   63 Text Interpretation:  Sinus rhythm with occasional Premature ventricular complexes New since previous tracing Otherwise normal ECG Confirmed by KNOTT MD, Quillian Quince [id number]) on 07/22/2016 7:29:54 PM       Radiology Dg Chest 2 View  Result Date: 07/22/2016 CLINICAL DATA:  Chronic cough and shortness breath for the past year. Symptoms are worsening. History of asthma, former smoker, previous MI. EXAM: CHEST  2 VIEW COMPARISON:  Chest x-ray of Dec 04, 2015 FINDINGS: The lungs are mildly hyperinflated with hemidiaphragm flattening. The interstitial markings are coarse. There is no alveolar infiltrate or pleural effusion. No pulmonary parenchymal masses or nodules are observed. The heart and pulmonary vascularity are normal. The mediastinum is normal in width. There is mild multilevel degenerative disc disease of the thoracic spine. IMPRESSION:  Chronic bronchitic changes.  No alveolar pneumonia nor CHF. Electronically Signed   By: David  Martinique M.D.   On: 07/22/2016 12:39    Procedures Procedures (including critical care time)  Medications Ordered in ED Medications  ipratropium-albuterol (DUONEB) 0.5-2.5 (3) MG/3ML nebulizer solution 3 mL (3 mLs Nebulization Given 07/22/16 1831)  predniSONE (DELTASONE) tablet 60 mg (60 mg Oral Given 07/22/16 1830)  ipratropium-albuterol (DUONEB) 0.5-2.5 (3) MG/3ML nebulizer solution 3 mL (3 mLs Nebulization Given 07/22/16 1915)  albuterol (PROVENTIL HFA;VENTOLIN HFA) 108 (90 Base) MCG/ACT inhaler 2 puff (2 puffs Inhalation Given 07/22/16 2006)  AEROCHAMBER PLUS FLO-VU MEDIUM MISC 1 each (1 each Other Given 07/22/16 2006)     Initial Impression / Assessment and Plan / ED Course  I have reviewed the triage vital signs and the nursing notes.  Pertinent labs & imaging results that were available during my care of the patient were reviewed by me and considered in my medical decision making (  see chart for details).  Clinical Course     This is an 83 year old male with a past medical history as documented above who presents today with worsening shortness of breath over the last few days. Patient has not been formally diagnosed with chronic lung disease but I suspect he does given his long term history of smoking. Patient states he quit smoking several years ago however. Patient was given several rounds of DuoNeb nebs here with improvement in his symptoms. Additionally he was given a dose of prednisone as well.  Given his improvement and well appearance, we will discharge home with course of prednisone. Advised follow-up with his primary doctor in the next couple days for reevaluation. Patient was given more albuterol to use a home as needed. Patient is agreeable with this plan and discharged home in good condition.  Final Clinical Impressions(s) / ED Diagnoses   Final diagnoses:  Cough  Shortness of breath      New Prescriptions Discharge Medication List as of 07/22/2016  7:58 PM    START taking these medications   Details  predniSONE (DELTASONE) 50 MG tablet Take 1 tablet (50 mg total) by mouth daily with breakfast., Starting Tue 07/22/2016, Until Sat 07/26/2016, Print         Theodosia Quay, MD 07/23/16 [id number]    Leo Grosser, MD 07/23/16 [phone]

## 2016-07-22 NOTE — ED Triage Notes (Addendum)
Pt was to have appt this morning with Dr. Rex Kras- [place], office called to reschedule d/t water pipe problems.  Is supposed to go tomorrow if office opens up.  Pt has had chronic cough and shortness of breath x 1 year.  Last antibiotic was in October.  Symptoms keep worsening.  Pt is almost out of inhaler and needs this refilled today.  Pt talking in complete sentences.  NAD at triage. Pt also c/o chest tightness

## 2016-07-22 NOTE — ED Notes (Signed)
Pt finished breathing treatment

## 2016-09-12 ENCOUNTER — Other Ambulatory Visit: Payer: Self-pay | Admitting: Family Medicine

## 2016-09-12 DIAGNOSIS — R0600 Dyspnea, unspecified: Secondary | ICD-10-CM

## 2016-09-12 DIAGNOSIS — J449 Chronic obstructive pulmonary disease, unspecified: Secondary | ICD-10-CM

## 2016-09-17 ENCOUNTER — Ambulatory Visit
Admission: RE | Admit: 2016-09-17 | Discharge: 2016-09-17 | Disposition: A | Discharge status: Home / Self Care | Payer: Medicare Other | Source: Ambulatory Visit | Attending: Family Medicine | Admitting: Family Medicine

## 2016-09-17 DIAGNOSIS — R0600 Dyspnea, unspecified: Secondary | ICD-10-CM

## 2016-09-17 DIAGNOSIS — J449 Chronic obstructive pulmonary disease, unspecified: Secondary | ICD-10-CM

## 2016-12-23 ENCOUNTER — Other Ambulatory Visit: Payer: Self-pay | Admitting: Family Medicine

## 2016-12-23 DIAGNOSIS — J449 Chronic obstructive pulmonary disease, unspecified: Secondary | ICD-10-CM

## 2016-12-24 ENCOUNTER — Ambulatory Visit
Admission: RE | Admit: 2016-12-24 | Discharge: 2016-12-24 | Disposition: A | Discharge status: Home / Self Care | Payer: Medicare Other | Source: Ambulatory Visit | Attending: Family Medicine | Admitting: Family Medicine

## 2016-12-24 DIAGNOSIS — J449 Chronic obstructive pulmonary disease, unspecified: Secondary | ICD-10-CM

## 2016-12-24 MED ORDER — IOPAMIDOL (ISOVUE-300) INJECTION 61%
75.0000 mL | Freq: Once | INTRAVENOUS | Status: AC | PRN
  Administered 2016-12-24: 75 mL via INTRAVENOUS

## 2019-04-06 ENCOUNTER — Emergency Department (HOSPITAL_COMMUNITY)
Admission: EM | Admit: 2019-04-06 | Discharge: 2019-04-06 | Disposition: A | Discharge status: Home / Self Care | Payer: No Typology Code available for payment source | Attending: Emergency Medicine | Admitting: Emergency Medicine

## 2019-04-06 ENCOUNTER — Encounter (HOSPITAL_COMMUNITY): Payer: Self-pay | Admitting: Radiology

## 2019-04-06 ENCOUNTER — Emergency Department (HOSPITAL_COMMUNITY): Payer: No Typology Code available for payment source

## 2019-04-06 DIAGNOSIS — I10 Essential (primary) hypertension: Secondary | ICD-10-CM | POA: Insufficient documentation

## 2019-04-06 DIAGNOSIS — Z87891 Personal history of nicotine dependence: Secondary | ICD-10-CM | POA: Diagnosis not present

## 2019-04-06 DIAGNOSIS — Z79899 Other long term (current) drug therapy: Secondary | ICD-10-CM | POA: Insufficient documentation

## 2019-04-06 DIAGNOSIS — R42 Dizziness and giddiness: Secondary | ICD-10-CM | POA: Diagnosis not present

## 2019-04-06 DIAGNOSIS — J45909 Unspecified asthma, uncomplicated: Secondary | ICD-10-CM | POA: Diagnosis not present

## 2019-04-06 LAB — BASIC METABOLIC PANEL
Anion gap: 11 (ref 5–15)
BUN: 15 mg/dL (ref 8–23)
CO2: 23 mmol/L (ref 22–32)
Calcium: 8.9 mg/dL (ref 8.9–10.3)
Chloride: 106 mmol/L (ref 98–111)
Creatinine, Ser: 1.06 mg/dL (ref 0.61–1.24)
GFR calc Af Amer: >60 mL/min (ref >60)
GFR calc non Af Amer: >60 mL/min (ref >60)
Glucose, Bld: 84 mg/dL (ref 70–99)
Potassium: 4 mmol/L (ref 3.5–5.1)
Sodium: 140 mmol/L (ref 135–145)

## 2019-04-06 LAB — URINALYSIS, ROUTINE W REFLEX MICROSCOPIC
Bilirubin Urine: NEGATIVE
Glucose, UA: NEGATIVE mg/dL
Hgb urine dipstick: NEGATIVE
Ketones, ur: NEGATIVE mg/dL
Leukocytes,Ua: NEGATIVE
Nitrite: NEGATIVE
Protein, ur: NEGATIVE mg/dL
Specific Gravity, Urine: 1.002 — ABNORMAL LOW (ref 1.005–1.030)
pH: 6 (ref 5.0–8.0)

## 2019-04-06 LAB — CBC
HCT: 42.5 % (ref 39.0–52.0)
Hemoglobin: 14.5 g/dL (ref 13.0–17.0)
MCH: 32.5 pg (ref 26.0–34.0)
MCHC: 34.1 g/dL (ref 30.0–36.0)
MCV: 95.3 fL (ref 80.0–100.0)
Platelets: 210 10*3/uL (ref 150–400)
RBC: 4.46 MIL/uL (ref 4.22–5.81)
RDW: 12.9 % (ref 11.5–15.5)
WBC: 5.5 10*3/uL (ref 4.0–10.5)
nRBC: 0 % (ref 0.0–0.2)

## 2019-04-06 MED ORDER — SODIUM CHLORIDE 0.9% FLUSH: 3.0000 mL | Freq: Once | INTRAVENOUS | Status: DC

## 2019-04-06 MED ORDER — MECLIZINE HCL 25 MG PO TABS
25.0000 mg | ORAL_TABLET | Freq: Once | ORAL | Status: AC
  Administered 2019-04-06: 19:00:00 25 mg via ORAL
  Filled 2019-04-06: qty 1

## 2019-04-06 MED ORDER — MECLIZINE HCL 25 MG PO TABS: 25.0000 mg | ORAL_TABLET | Freq: Three times a day (TID) | ORAL | 0 refills | Status: AC | PRN

## 2019-04-06 NOTE — Discharge Instructions (Signed)
Follow-up with your primary care provider. Return to the ED if you start to have worsening symptoms, numbness in arms or legs, headache with blurry vision, injuries or falls.

## 2019-04-06 NOTE — ED Triage Notes (Signed)
Pt arrives from home with c/o of dizziness that began as soon as he opened his eyes this morning around 0600. Pt states he feels dizzy and off balance when he walks. Pt clear LSN time. Pt has no weakness VAN -. Pt is alert and ox4.

## 2019-04-06 NOTE — ED Notes (Signed)
Pt returned from mri

## 2019-04-06 NOTE — ED Provider Notes (Signed)
[facility] EMERGENCY DEPARTMENT Provider Note   CSN: [id number] Arrival date & time: 04/06/19  1331     History   Chief Complaint Chief Complaint  Patient presents with   Dizziness    HPI Robert Curry is a 85 y.o. male with a past medical history of diverticulosis, hypertension presents to ED for evaluation of dizziness.  Patient states that every day since 04/02/2019, he has woken up with dizziness, feeling like the room is spinning around him and "wobbly."  States that his symptoms will gradually improve throughout the day and he will usually feel back to his normal self before he goes to sleep.  This happened to him again this morning.  He was evaluated by EMS twice since this happened but did not want to come to the emergency department.  He has not been diagnosed with vertigo in the past.  He has not tried any medications to help with his symptoms.  He denies any headache, numbness in arms or legs, vision changes, vomiting, chest pain, shortness of breath, fever, injuries or falls.  He reports compliance with his home medications and denies any anticoagulant use. Son at bedside states he has not noticed any facial asymmetry.     HPI  Past Medical History:  Diagnosis Date   Asthma    Cancer ([place])    Diverticulosis    Flu    Hypertension    Kidney stones     Patient Active Problem List   Diagnosis Date Noted   NSTEMI (non-ST elevated myocardial infarction) ([place]) 12/04/2015    Past Surgical History:  Procedure Laterality Date   CARDIAC CATHETERIZATION N/A 12/04/2015   Procedure: Left Heart Cath and Coronary Angiography;  Surgeon: Adrian Prows, MD;  Location: [facility];  Service: Cardiovascular;  Laterality: N/A;   CARDIAC CATHETERIZATION N/A 12/04/2015   Procedure: Intravascular Ultrasound/IVUS;  Surgeon: Adrian Prows, MD;  Location: [facility];  Service: Cardiovascular;  Laterality: N/A;   CATARACT EXTRACTION Left     PROSTATECTOMY          Home Medications    Prior to Admission medications   Medication Sig Start Date End Date Taking? Authorizing Provider  atorvastatin (LIPITOR) 80 MG tablet Take 1 tablet (80 mg total) by mouth daily at 6 PM. 12/05/15  Yes Adrian Prows, MD  budesonide-formoterol [facility]) 80-4.5 MCG/ACT inhaler Inhale 2 puffs into the lungs 2 (two) times daily.   Yes [provider]  losartan (COZAAR) 50 MG tablet Take 1 tablet by mouth daily. 10/31/15  Yes [provider]  PROAIR HFA 108 (90 Base) MCG/ACT inhaler Inhale 2 puffs into the lungs every 4 (four) hours as needed for shortness of breath. wheezing 10/31/15  Yes [provider]  meclizine (ANTIVERT) 25 MG tablet Take 1 tablet (25 mg total) by mouth 3 (three) times daily as needed for dizziness. 04/06/19   Delia Heady, PA-C    Family History No family history on file.  Social History Social History   Tobacco Use   Smoking status: Former Smoker   Smokeless tobacco: Former Systems developer   Tobacco comment: quit smoking in  2001  Substance Use Topics   Alcohol use: No   Drug use: No     Allergies   Patient has no known allergies.   Review of Systems Review of Systems  Constitutional: Negative for appetite change, chills and fever.  HENT: Negative for ear pain, rhinorrhea, sneezing and sore throat.   Eyes: Negative  for photophobia and visual disturbance.  Respiratory: Negative for cough, chest tightness, shortness of breath and wheezing.   Cardiovascular: Negative for chest pain and palpitations.  Gastrointestinal: Negative for abdominal pain, blood in stool, constipation, diarrhea, nausea and vomiting.  Genitourinary: Negative for dysuria, hematuria and urgency.  Musculoskeletal: Negative for myalgias.  Skin: Negative for rash.  Neurological: Positive for dizziness. Negative for facial asymmetry, weakness, light-headedness, numbness and headaches.     Physical Exam Updated Vital  Signs BP (!) 152/97    Pulse (!) 54    Temp 98.5 F (36.9 C) (Oral)    Resp 12    SpO2 98%   Physical Exam Vitals signs and nursing note reviewed.  Constitutional:      General: He is not in acute distress.    Appearance: He is well-developed.  HENT:     Head: Normocephalic and atraumatic.     Nose: Nose normal.  Eyes:     General: No scleral icterus.       Right eye: No discharge.        Left eye: No discharge.     Conjunctiva/sclera: Conjunctivae normal.     Pupils: Pupils are equal, round, and reactive to light.  Neck:     Musculoskeletal: Normal range of motion and neck supple.  Cardiovascular:     Rate and Rhythm: Normal rate and regular rhythm.     Heart sounds: Normal heart sounds. No murmur. No friction rub. No gallop.   Pulmonary:     Effort: Pulmonary effort is normal. No respiratory distress.     Breath sounds: Normal breath sounds.  Abdominal:     General: Bowel sounds are normal. There is no distension.     Palpations: Abdomen is soft.     Tenderness: There is no abdominal tenderness. There is no guarding.  Musculoskeletal: Normal range of motion.  Skin:    General: Skin is warm and dry.     Findings: No rash.  Neurological:     General: No focal deficit present.     Mental Status: He is alert and oriented to person, place, and time.     Cranial Nerves: No cranial nerve deficit.     Sensory: No sensory deficit.     Motor: No weakness or abnormal muscle tone.     Coordination: Coordination normal.     Comments: Pupils reactive. No facial asymmetry noted. Cranial nerves appear grossly intact. Sensation intact to light touch on face, BUE and BLE. Strength 5/5 in BUE and BLE.       ED Treatments / Results  Labs (all labs ordered are listed, but only abnormal results are displayed) Labs Reviewed  URINALYSIS, ROUTINE W REFLEX MICROSCOPIC - Abnormal; Notable for the following components:      Result Value   Color, Urine COLORLESS (*)    Specific Gravity,  Urine 1.002 (*)    All other components within normal limits  BASIC METABOLIC PANEL  CBC  CBG MONITORING, ED    EKG EKG Interpretation  Date/Time:  Wednesday April 06 2019 17:04:09 EDT Ventricular Rate:  50 PR Interval:    QRS Duration: 96 QT Interval:  442 QTC Calculation: 403 R Axis:   51 Text Interpretation:  Sinus rhythm Anteroseptal infarct, old Confirmed by Quintella Reichert [phone]) on 04/06/2019 5:05:30 PM   Radiology Mr Brain Wo Contrast  Result Date: 04/06/2019 CLINICAL DATA:  85 year old male with dizziness since 0600 hours. EXAM: MRI HEAD WITHOUT CONTRAST TECHNIQUE: Multiplanar, multiecho pulse sequences of  the brain and surrounding structures were obtained without intravenous contrast. COMPARISON:  None. FINDINGS: Brain: No restricted diffusion to suggest acute infarction. No midline shift, mass effect, evidence of mass lesion, ventriculomegaly, extra-axial collection or acute intracranial hemorrhage. Cervicomedullary junction and pituitary are within normal limits. Numerous dilated perivascular spaces in both cerebral hemispheres plus nonspecific scattered and patchy white matter T2 and FLAIR hyperintensity. No cortical encephalomalacia or chronic blood products identified. Mild signal heterogeneity in the deep gray nuclei which appears mostly due to perivascular spaces. There might be a tiny chronic lacunar infarct in the right cerebellum on series 6, image 5, but otherwise the brainstem and cerebellum are normal for age. Vascular: Major intracranial vascular flow voids are preserved. Skull and upper cervical spine: Negative visible cervical spine. Normal bone marrow signal. Sinuses/Orbits: Postoperative changes to both globes, otherwise negative orbits. Mild bilateral sinus mucosal thickening primarily at the anterior ethmoids. Other: Mastoids are clear. Visible internal auditory structures appear normal. Normal stylomastoid foramina. Scalp and face soft tissues appear  negative. IMPRESSION: 1.  No acute intracranial abnormality. 2. Overall mild for age signal changes in the brain, most commonly due to chronic small vessel disease. 3. Mild paranasal sinus inflammation. Electronically Signed   By: Genevie Ann M.D.   On: 04/06/2019 19:47    Procedures Procedures (including critical care time)  Medications Ordered in ED Medications  sodium chloride flush (NS) 0.9 % injection 3 mL (has no administration in time range)  meclizine (ANTIVERT) tablet 25 mg (25 mg Oral Given 04/06/19 1833)     Initial Impression / Assessment and Plan / ED Course  I have reviewed the triage vital signs and the nursing notes.  Pertinent labs & imaging results that were available during my care of the patient were reviewed by me and considered in my medical decision making (see chart for details).        85 year old male with a past medical history of hypertension who presents to ED for dizziness.  Symptoms have been present every morning since 04/02/2019 gradually resolved throughout the day.  Been evaluated by EMS twice but did not want to be evaluated in the ED.  In my exam patient is overall well-appearing.  No deficits neurological exam noted.  No neck pain.  Denies any injuries or falls.  No weakness, numbness or facial asymmetry noted.  Vital signs within normal limits.  EKG shows sinus rhythm.  CBC, BMP, urinalysis unremarkable.  MRI of the brain shows no acute abnormalities.  Patient given meclizine with improvement in his symptoms.  Suspect that his symptoms are due to vertigo.  Doubt CVA, TIA or other emergent cause of his symptoms.  Patient agreeable to discharge home with as needed meclizine.  Will call PCP in the morning and return for worsening symptoms.  Patient is hemodynamically stable, in NAD, and able to ambulate in the ED. Evaluation does not show pathology that would require ongoing emergent intervention or inpatient treatment. I explained the diagnosis to the patient.  Pain has been managed and has no complaints prior to discharge. Patient is comfortable with above plan and is stable for discharge at this time. All questions were answered prior to disposition. Strict return precautions for returning to the ED were discussed. Encouraged follow up with PCP.   An After Visit Summary was printed and given to the patient.   Portions of this note were generated with [employer]. Dictation errors may occur despite best attempts at proofreading.   Final Clinical Impressions(s) /  ED Diagnoses   Final diagnoses:  Vertigo    ED Discharge Orders         Ordered    meclizine (ANTIVERT) 25 MG tablet  3 times daily PRN     04/06/19 2024           Delia Heady, PA-C 04/06/19 2028    Quintella Reichert, MD 04/06/19 2254    Quintella Reichert, MD 04/06/19 2321

## 2019-04-06 NOTE — ED Notes (Signed)
To mri 

## 2019-04-06 NOTE — ED Notes (Signed)
All appropriate discharge materials reviewed with patient at length. Time for questions provided. Pt denies any further questions at this time. Verbalizes understanding of all provided materials.  

## 2020-03-19 IMAGING — MR MR HEAD W/O CM
9 of 10 series · 37 of 48 positions shown · non-contrast
Comparison: None.

CLINICAL DATA: 89-year-old male with dizziness since 0300 hours.

EXAM:
MRI HEAD WITHOUT CONTRAST
TECHNIQUE: Multiplanar, multiecho pulse sequences of the brain and surrounding
structures were obtained without intravenous contrast.

[Series 3: DWI · axial · 3.0mm · 1.09mm/px · z∈[+13,+139]mm · 11 of 94 slices shown (1 of 4)]
[im 1/94]
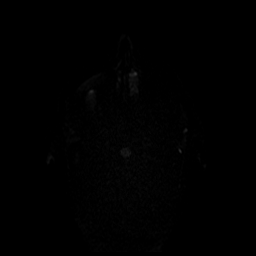
[im 10/94]
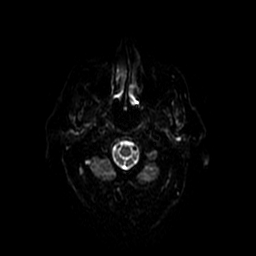
[im 19/94]
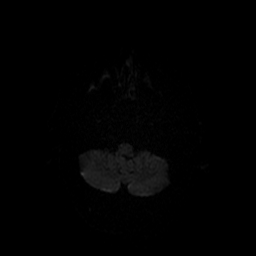
[im 28/94]
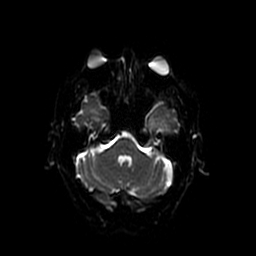
[im 38/94]
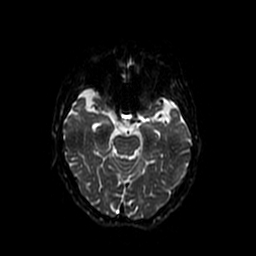
[im 47/94]
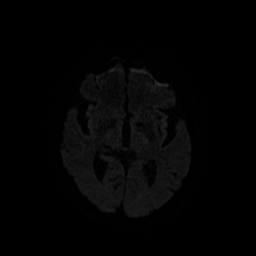
[im 56/94]
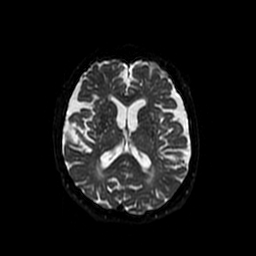
[im 66/94]
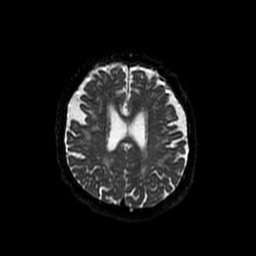
[im 75/94]
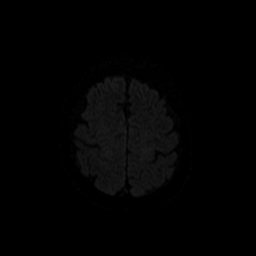
[im 84/94]
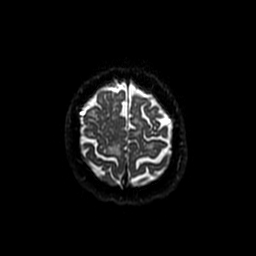
[im 94/94]
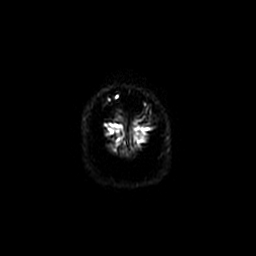

[Series 4: DWI · coronal · 5.0mm · 1.09mm/px · 7 of 72 slices shown (2 of 4)]
[im 1/72]
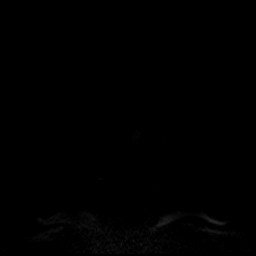
[im 12/72]
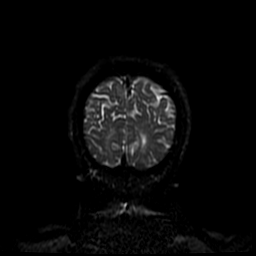
[im 24/72]
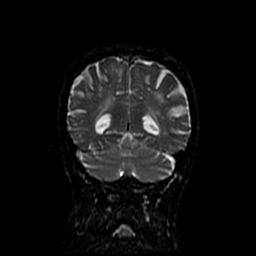
[im 36/72]
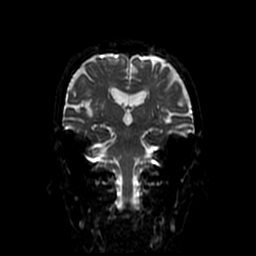
[im 48/72]
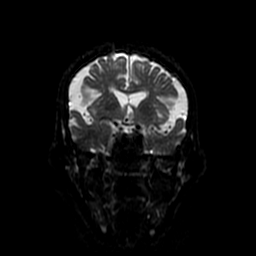
[im 60/72]
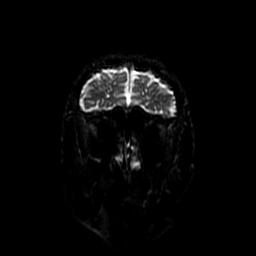
[im 72/72]
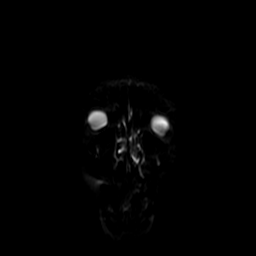

[Series 5: T1 · sagittal · 5.0mm · 0.47mm/px · 2 of 23 slices shown (1 of 2)]
[im 1/23]
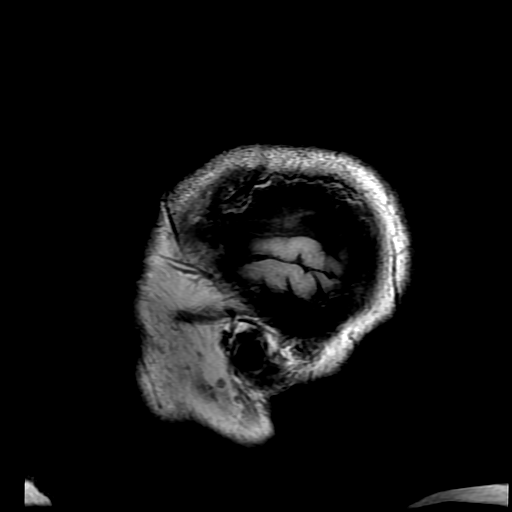
[im 23/23]
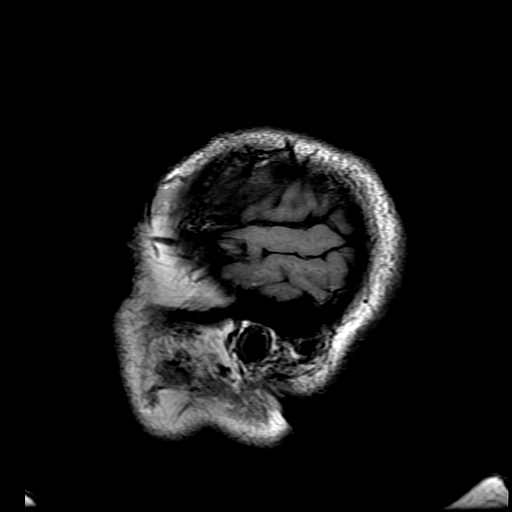

[Series 6: T2 · axial · 5.0mm · 0.43mm/px · z∈[-1,+125]mm · 2 of 24 slices shown (1 of 2)]
[im 1/24]
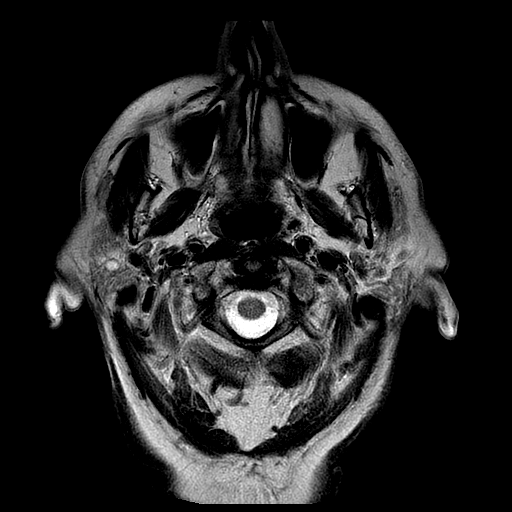
[im 24/24]
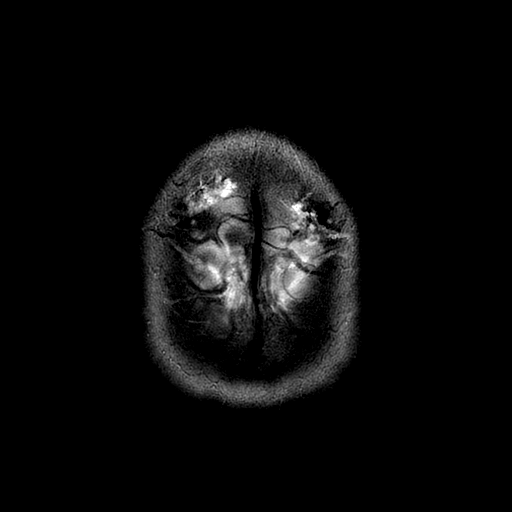

[Series 7: FLAIR · axial · 5.0mm · 0.43mm/px · z∈[-1,+125]mm · 2 of 24 slices shown]
[im 1/24]
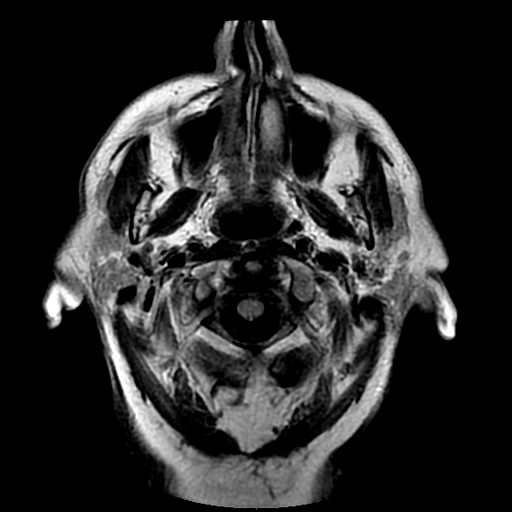
[im 24/24]
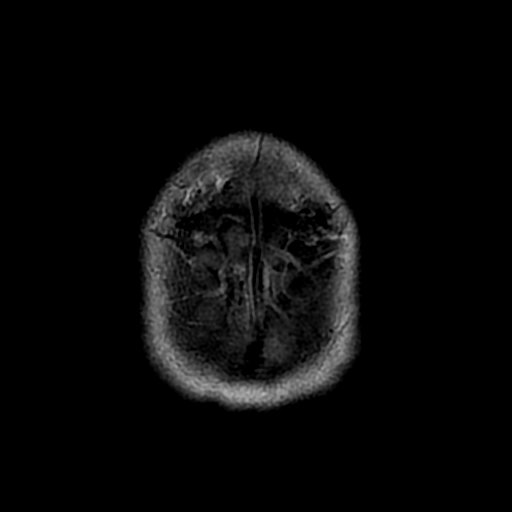

[Series 9: T1 · axial · 3.0mm · 0.47mm/px · 1 of 96 slices shown (2 of 2)]
[im 1/96]
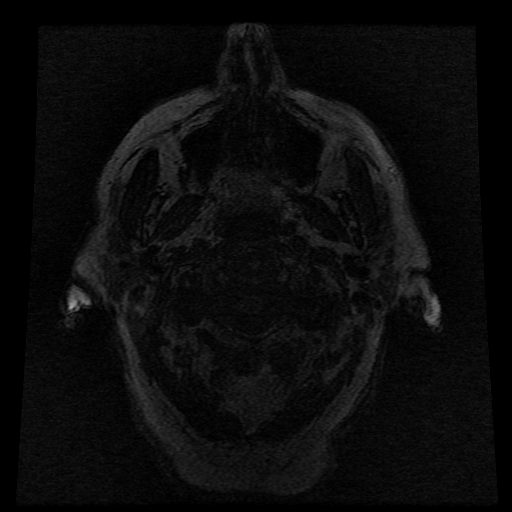

[Series 10: T2 · coronal · 5.0mm · 0.43mm/px · 3 of 30 slices shown (2 of 2)]
[im 1/30]
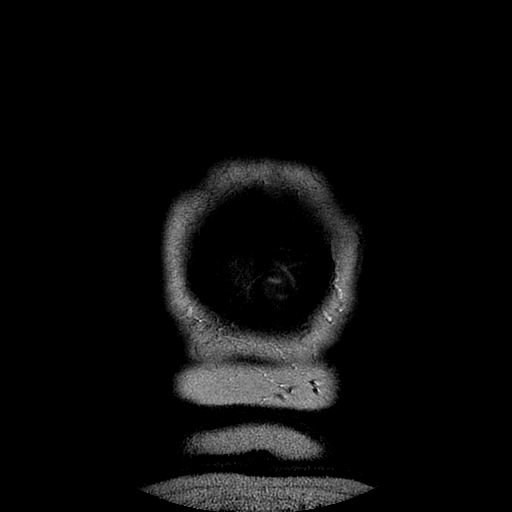
[im 15/30]
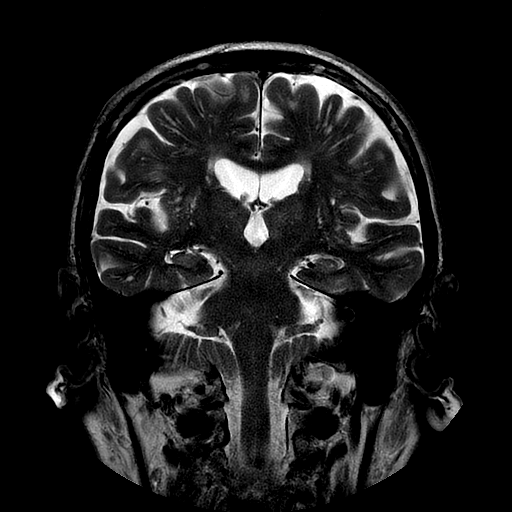
[im 30/30]
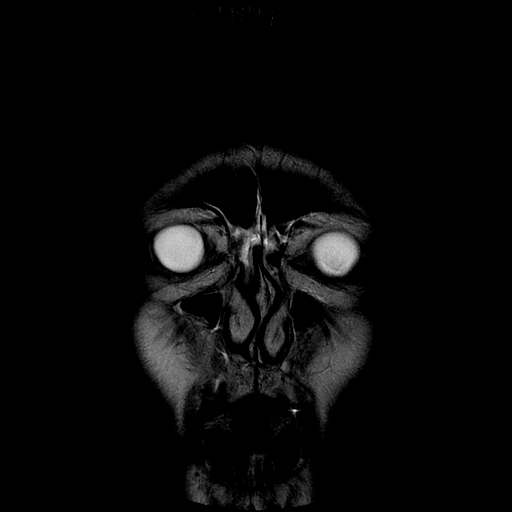

[Series 300: DWI · axial · 3.0mm · 1.09mm/px · z∈[+13,+139]mm · 5 of 47 slices shown (3 of 4)]
[im 1/47]
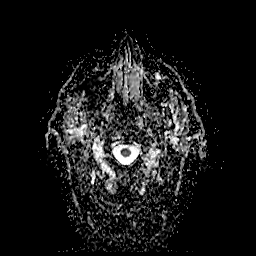
[im 12/47]
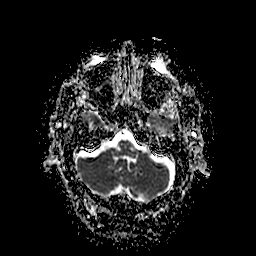
[im 24/47]
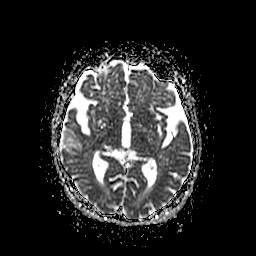
[im 35/47]
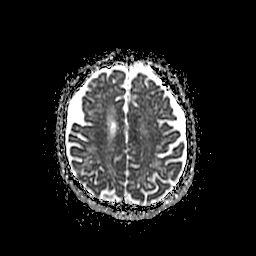
[im 47/47]
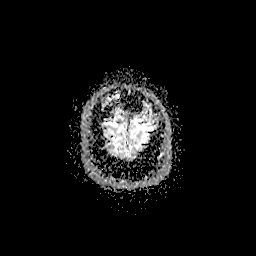

[Series 400: DWI · coronal · 5.0mm · 1.09mm/px · 4 of 36 slices shown (4 of 4)]
[im 1/36]
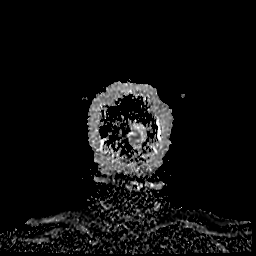
[im 12/36]
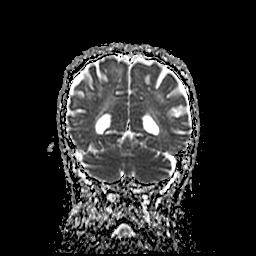
[im 24/36]
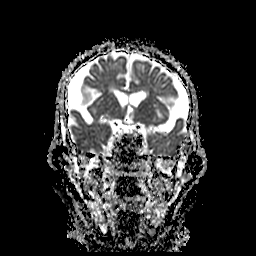
[im 36/36]
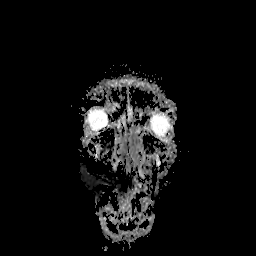

[37 of 48 positions shown; findings below may reference images not displayed]

FINDINGS: Brain: No restricted diffusion to suggest acute infarction. No
midline shift, mass effect, evidence of mass lesion,
ventriculomegaly, extra-axial collection or acute intracranial
hemorrhage. Cervicomedullary junction and pituitary are within
normal limits.

Numerous dilated perivascular spaces in both cerebral hemispheres
plus nonspecific scattered and patchy white matter T2 and FLAIR
hyperintensity. No cortical encephalomalacia or chronic blood
products identified. Mild signal heterogeneity in the deep gray
nuclei which appears mostly due to perivascular spaces. There might
be a tiny chronic lacunar infarct in the right cerebellum on series
6, image 5, but otherwise the brainstem and cerebellum are normal
for age.

Vascular: Major intracranial vascular flow voids are preserved.

Skull and upper cervical spine: Negative visible cervical spine.
Normal bone marrow signal.

Sinuses/Orbits: Postoperative changes to both globes, otherwise
negative orbits. Mild bilateral sinus mucosal thickening primarily
at the anterior ethmoids.

Other: Mastoids are clear. Visible internal auditory structures
appear normal. Normal stylomastoid foramina. Scalp and face soft
tissues appear negative.
IMPRESSION: 1.  No acute intracranial abnormality.
2. Overall mild for age signal changes in the brain, most commonly
due to chronic small vessel disease.
3. Mild paranasal sinus inflammation.

## 2021-01-28 ENCOUNTER — Ambulatory Visit (HOSPITAL_COMMUNITY)
Admission: EM | Admit: 2021-01-28 | Discharge: 2021-01-28 | Disposition: A | Discharge status: Home / Self Care | Payer: No Typology Code available for payment source

## 2021-01-28 ENCOUNTER — Other Ambulatory Visit: Payer: Self-pay

## 2021-01-28 ENCOUNTER — Encounter (HOSPITAL_COMMUNITY): Payer: Self-pay

## 2021-01-28 DIAGNOSIS — H60501 Unspecified acute noninfective otitis externa, right ear: Secondary | ICD-10-CM | POA: Diagnosis not present

## 2021-01-28 DIAGNOSIS — H6690 Otitis media, unspecified, unspecified ear: Secondary | ICD-10-CM | POA: Diagnosis not present

## 2021-01-28 MED ORDER — CIPRO HC 0.2-1 % OT SUSP: 3.0000 [drp] | Freq: Two times a day (BID) | OTIC | 0 refills | Status: AC

## 2021-01-28 MED ORDER — CLINDAMYCIN HCL 300 MG PO CAPS: 300.0000 mg | ORAL_CAPSULE | Freq: Three times a day (TID) | ORAL | 0 refills | Status: AC

## 2021-01-28 NOTE — ED Triage Notes (Addendum)
Pt reports bilateral ear itching for weeks. States left ear improved, but starting Saturday that right ear began to have throbbing pain and swelling. Pt with hearing loss and bilateral hearing aids. Reports ringing in ear. reports using tylenol and hydrocortisone cream for ears.

## 2021-01-28 NOTE — ED Provider Notes (Signed)
[facility]    CSN: [id number] Arrival date & time: 01/28/21  0856      History   Chief Complaint Chief Complaint  Patient presents with   Otalgia   Tinnitus    HPI Robert Curry is a 87 y.o. male.   Patient presents to the urgent care today for right-sided ear pain that has been present for approximately 2 weeks.  Patient reports that left ear pain was also present about a week ago but resolved.  Also endorses itchiness of ears.  Patient has bilateral hearing aids due to chronic hearing loss.  Denies any additional hearing loss due to current symptoms.  Patient has used over-the-counter Tylenol as needed for pain with some relief of pain.  Patient also reports a swelling of right ear and side of face.  Denies any upper respiratory symptoms.  Has low-grade fever on original exam but denies any fevers at home.   Otalgia  Past Medical History:  Diagnosis Date   Asthma    Cancer ([place])    Diverticulosis    Flu    Hypertension    Kidney stones     Patient Active Problem List   Diagnosis Date Noted   NSTEMI (non-ST elevated myocardial infarction) ([place]) 12/04/2015    Past Surgical History:  Procedure Laterality Date   CARDIAC CATHETERIZATION N/A 12/04/2015   Procedure: Left Heart Cath and Coronary Angiography;  Surgeon: Adrian Prows, MD;  Location: [place] [facility];  Service: Cardiovascular;  Laterality: N/A;   CARDIAC CATHETERIZATION N/A 12/04/2015   Procedure: Intravascular Ultrasound/IVUS;  Surgeon: Adrian Prows, MD;  Location: [facility];  Service: Cardiovascular;  Laterality: N/A;   CATARACT EXTRACTION Left    PROSTATECTOMY         Home Medications    Prior to Admission medications   Medication Sig Start Date End Date Taking? Authorizing Provider  ALBUTEROL IN Inhale into the lungs.   Yes [provider]  amLODipine (NORVASC) 10 MG tablet Take 10 mg by mouth daily.   Yes [provider]  ciprofloxacin-hydrocortisone (CIPRO HC)  OTIC suspension Place 3 drops into the right ear 2 (two) times daily for 7 days. 01/28/21 02/04/21 Yes Odis Luster, FNP  clindamycin (CLEOCIN) 300 MG capsule Take 1 capsule (300 mg total) by mouth 3 (three) times daily for 10 days. 01/28/21 02/07/21 Yes Odis Luster, FNP  Fluticasone-Salmeterol ([place]) Inhale into the lungs.   Yes [provider]  hydrocortisone cream 1 % Apply 1 application topically 2 (two) times daily.   Yes [provider]  losartan (COZAAR) 50 MG tablet Take 1 tablet by mouth daily. 10/31/15  Yes [provider]  PROAIR HFA 108 (90 Base) MCG/ACT inhaler Inhale 2 puffs into the lungs every 4 (four) hours as needed for shortness of breath. wheezing 10/31/15  Yes [provider]  [place] Name: pt reports taking preservision bid.   Yes [provider]  atorvastatin (LIPITOR) 80 MG tablet Take 1 tablet (80 mg total) by mouth daily at 6 PM. 12/05/15   Adrian Prows, MD  budesonide-formoterol [facility]) 80-4.5 MCG/ACT inhaler Inhale 2 puffs into the lungs 2 (two) times daily.    [provider]  meclizine (ANTIVERT) 25 MG tablet Take 1 tablet (25 mg total) by mouth 3 (three) times daily as needed for dizziness. 04/06/19   Delia Heady, PA-C    Family History History reviewed. No pertinent family history.  Social History Social  History   Tobacco Use   Smoking status: Former    Pack years: 0.00   Smokeless tobacco: Current    Types: Chew   Tobacco comments:    quit smoking in  2001  Substance Use Topics   Alcohol use: No   Drug use: No     Allergies   Patient has no known allergies.   Review of Systems Review of Systems  HENT:  Positive for ear pain.   Per HPI  Physical Exam Triage Vital Signs ED Triage Vitals  Enc Vitals Group     BP 01/28/21 0958 137/68     Pulse Rate 01/28/21 0958 66     Resp 01/28/21 0958 18     Temp 01/28/21 0958 99 F (37.2 C)     Temp src --      SpO2 01/28/21  0958 98 %     Weight --      Height --      Head Circumference --      Peak Flow --      Pain Score 01/28/21 0949 10     Pain Loc --      Pain Edu? --      Excl. in [place]? --    No data found.  Updated Vital Signs BP 137/68   Pulse 66   Temp 99 F (37.2 C)   Resp 18   SpO2 98%   Visual Acuity Right Eye Distance:   Left Eye Distance:   Bilateral Distance:    Right Eye Near:   Left Eye Near:    Bilateral Near:     Physical Exam Constitutional:      Appearance: Normal appearance.  HENT:     Head: Normocephalic and atraumatic.     Right Ear: Swelling and tenderness present. Tympanic membrane is erythematous. Tympanic membrane is not perforated or bulging.     Left Ear: Ear canal normal. A middle ear effusion is present.     Ears:     Comments: Mild swelling to right external canal.    Nose: Nose normal.  Eyes:     Extraocular Movements: Extraocular movements intact.     Conjunctiva/sclera: Conjunctivae normal.  Cardiovascular:     Rate and Rhythm: Normal rate and regular rhythm.     Pulses: Normal pulses.     Heart sounds: Normal heart sounds.  Pulmonary:     Effort: Pulmonary effort is normal.     Breath sounds: Normal breath sounds.  Musculoskeletal:        General: Normal range of motion.     Cervical back: Normal range of motion.  Lymphadenopathy:     Head:     Right side of head: Preauricular adenopathy present.     Cervical: Cervical adenopathy present.     Right cervical: Superficial cervical adenopathy present.     Left cervical: No superficial, deep or posterior cervical adenopathy.  Skin:    General: Skin is warm and dry.  Neurological:     General: No focal deficit present.     Mental Status: He is alert and oriented to person, place, and time. Mental status is at baseline.  Psychiatric:        Mood and Affect: Mood normal.        Behavior: Behavior normal.        Thought Content: Thought content normal.        Judgment: Judgment normal.      UC Treatments / Results  Labs (  all labs ordered are listed, but only abnormal results are displayed) Labs Reviewed - No data to display  EKG   Radiology No results found.  Procedures Procedures (including critical care time)  Medications Ordered in UC Medications - No data to display  Initial Impression / Assessment and Plan / UC Course  I have reviewed the triage vital signs and the nursing notes.  Pertinent labs & imaging results that were available during my care of the patient were reviewed by me and considered in my medical decision making (see chart for details).     We will treat right otitis externa in right otitis media with clindamycin x10 days in addition to Cipro-hydrocortisone antibiotic eardrops.  Patient was vies to take right ear hearing aid out until right ear heals.  Patient was advised to go to the hospital if hearing worsens, pain worsens, swelling worsens.  Patient was provided with ENT contact information if symptoms do not improve.  Monitor and manage fever. May take Tylenol as needed over-the-counter for pain and fever.Discussed strict return precautions. Patient verbalized understanding and is agreeable with plan.  Final Clinical Impressions(s) / UC Diagnoses   Final diagnoses:  Acute otitis externa of right ear, unspecified type  Acute otitis media, unspecified otitis media type     Discharge Instructions      You are being treated for right ear infection with antibiotic eardrops and oral antibiotic pills.  Please take antibiotic with food.  Please follow-up with provided contact information for ear, nose, throat doctors if symptoms do not improve with current treatment plan.  Please go to the hospital if symptoms worsen, pain worsens, hearing worsens.     ED Prescriptions     Medication Sig Dispense Auth. Provider   clindamycin (CLEOCIN) 300 MG capsule Take 1 capsule (300 mg total) by mouth 3 (three) times daily for 10 days. 30 capsule  Odis Luster, FNP   ciprofloxacin-hydrocortisone (CIPRO HC) OTIC suspension Place 3 drops into the right ear 2 (two) times daily for 7 days. 2.1 mL Odis Luster, FNP      PDMP not reviewed this encounter.   Odis Luster, FNP 01/28/21 1058

## 2021-01-28 NOTE — Discharge Instructions (Addendum)
You are being treated for right ear infection with antibiotic eardrops and oral antibiotic pills.  Please take antibiotic with food.  Please follow-up with provided contact information for ear, nose, throat doctors if symptoms do not improve with current treatment plan.  Please go to the hospital if symptoms worsen, pain worsens, hearing worsens.

## 2023-06-14 ENCOUNTER — Other Ambulatory Visit: Payer: Self-pay

## 2023-06-14 ENCOUNTER — Emergency Department (HOSPITAL_COMMUNITY)
Admission: EM | Admit: 2023-06-14 | Discharge: 2023-06-14 | Disposition: A | Discharge status: Home / Self Care | Payer: No Typology Code available for payment source | Attending: Emergency Medicine | Admitting: Emergency Medicine

## 2023-06-14 ENCOUNTER — Encounter (HOSPITAL_COMMUNITY): Payer: Self-pay

## 2023-06-14 ENCOUNTER — Emergency Department (HOSPITAL_COMMUNITY): Payer: No Typology Code available for payment source

## 2023-06-14 DIAGNOSIS — Z1152 Encounter for screening for COVID-19: Secondary | ICD-10-CM | POA: Diagnosis not present

## 2023-06-14 DIAGNOSIS — J209 Acute bronchitis, unspecified: Secondary | ICD-10-CM | POA: Diagnosis not present

## 2023-06-14 DIAGNOSIS — J449 Chronic obstructive pulmonary disease, unspecified: Secondary | ICD-10-CM | POA: Diagnosis not present

## 2023-06-14 DIAGNOSIS — Z7951 Long term (current) use of inhaled steroids: Secondary | ICD-10-CM | POA: Insufficient documentation

## 2023-06-14 DIAGNOSIS — R0602 Shortness of breath: Secondary | ICD-10-CM | POA: Diagnosis present

## 2023-06-14 LAB — BASIC METABOLIC PANEL
Anion gap: 11 (ref 5–15)
BUN: 14 mg/dL (ref 8–23)
CO2: 22 mmol/L (ref 22–32)
Calcium: 8.9 mg/dL (ref 8.9–10.3)
Chloride: 106 mmol/L (ref 98–111)
Creatinine, Ser: 0.99 mg/dL (ref 0.61–1.24)
GFR, Estimated: >60 mL/min (ref >60)
Glucose, Bld: 93 mg/dL (ref 70–99)
Potassium: 4.1 mmol/L (ref 3.5–5.1)
Sodium: 139 mmol/L (ref 135–145)

## 2023-06-14 LAB — CBC
HCT: 42.2 % (ref 39.0–52.0)
Hemoglobin: 13.8 g/dL (ref 13.0–17.0)
MCH: 30.6 pg (ref 26.0–34.0)
MCHC: 32.7 g/dL (ref 30.0–36.0)
MCV: 93.6 fL (ref 80.0–100.0)
Platelets: 235 10*3/uL (ref 150–400)
RBC: 4.51 MIL/uL (ref 4.22–5.81)
RDW: 12.9 % (ref 11.5–15.5)
WBC: 6.7 10*3/uL (ref 4.0–10.5)
nRBC: 0 % (ref 0.0–0.2)

## 2023-06-14 LAB — RESP PANEL BY RT-PCR (RSV, FLU A&B, COVID)  RVPGX2
Influenza A by PCR: NEGATIVE
Influenza B by PCR: NEGATIVE
Resp Syncytial Virus by PCR: NEGATIVE
SARS Coronavirus 2 by RT PCR: NEGATIVE

## 2023-06-14 LAB — TROPONIN I (HIGH SENSITIVITY)
Troponin I (High Sensitivity): 3 ng/L (ref <18)
Troponin I (High Sensitivity): 2 ng/L (ref <18)

## 2023-06-14 MED ORDER — PREDNISONE 10 MG PO TABS: 40.0000 mg | ORAL_TABLET | Freq: Every day | ORAL | 0 refills | Status: AC

## 2023-06-14 MED ORDER — CETIRIZINE HCL 5 MG PO TABS: 5.0000 mg | ORAL_TABLET | Freq: Every day | ORAL | 0 refills | Status: AC

## 2023-06-14 MED ORDER — ALBUTEROL SULFATE (2.5 MG/3ML) 0.083% IN NEBU
5.0000 mg | INHALATION_SOLUTION | Freq: Once | RESPIRATORY_TRACT | Status: AC
  Administered 2023-06-14: 5 mg via RESPIRATORY_TRACT

## 2023-06-14 MED ORDER — ALBUTEROL SULFATE (2.5 MG/3ML) 0.083% IN NEBU
5.0000 mg | INHALATION_SOLUTION | RESPIRATORY_TRACT | Status: DC
Start: 2023-06-14 — End: 2023-06-14
  Filled 2023-06-14: qty 6

## 2023-06-14 MED ORDER — METHYLPREDNISOLONE SODIUM SUCC 125 MG IJ SOLR
125.0000 mg | Freq: Once | INTRAMUSCULAR | Status: AC
  Administered 2023-06-14: 125 mg via INTRAVENOUS
  Filled 2023-06-14: qty 2

## 2023-06-14 NOTE — Discharge Instructions (Addendum)
We saw you in the ER for your breathing related complains. We gave you some breathing treatments in the ER, and seems like your symptoms have improved. Please take albuterol as needed every 4 hours. Please take the medications prescribed. Please refrain from smoking or smoke exposure. Please see a primary care doctor in 1 week. Return to the ER if your symptoms worsen.

## 2023-06-14 NOTE — ED Provider Notes (Signed)
[place] EMERGENCY DEPARTMENT AT [facility] Provider Note   CSN: [id number] Arrival date & time: 06/14/23  1419     History  Chief Complaint  Patient presents with   Shortness of Breath   Chest Pain    Robert Curry is a 89 y.o. male.  HPI    89 year old male comes in with chief complaint of shortness of breath and chest tightness. Patient has past medical history of COPD and NSTEMI.  He states that last night around 11 PM when he was trying to go to sleep, he started noticing difficulty in breathing.  He felt like his nose was clogged up.  He was having difficulty getting good respirations and he felt short of breath.  This morning as his symptoms have persisted, prompting him to come to the ER.  He has some chest tightness, but denies any chest pain.  Daughter is at the bedside.  She states that patient sounds congested and his voice is changed.  Patient has no chest pain and denies any fevers, malaise.  Patient also denies any wheezing, cough.  Home Medications Prior to Admission medications   Medication Sig Start Date End Date Taking? Authorizing Provider  cetirizine (ZYRTEC) 5 MG tablet Take 1 tablet (5 mg total) by mouth daily. 06/14/23  Yes Alezandra Egli, MD  predniSONE (DELTASONE) 10 MG tablet Take 4 tablets (40 mg total) by mouth daily. 06/14/23  Yes Derwood Kaplan, MD  ALBUTEROL IN Inhale into the lungs.    [provider]  amLODipine (NORVASC) 10 MG tablet Take 10 mg by mouth daily.    [provider]  atorvastatin (LIPITOR) 80 MG tablet Take 1 tablet (80 mg total) by mouth daily at 6 PM. 12/05/15   Yates Decamp, MD  budesonide-formoterol [facility]) 80-4.5 MCG/ACT inhaler Inhale 2 puffs into the lungs 2 (two) times daily.    [provider]  Fluticasone-Salmeterol [facility] INHUB IN) Inhale into the lungs.    [provider]  hydrocortisone cream 1 % Apply 1 application topically 2 (two) times daily.    [provider]  losartan (COZAAR) 50 MG tablet Take 1 tablet by mouth daily. 10/31/15   [provider]  meclizine (ANTIVERT) 25 MG tablet Take 1 tablet (25 mg total) by mouth 3 (three) times daily as needed for dizziness. 04/06/19   Khatri, Hina, PA-C  PROAIR HFA 108 (90 Base) MCG/ACT inhaler Inhale 2 puffs into the lungs every 4 (four) hours as needed for shortness of breath. wheezing 10/31/15   [provider]  UNABLE TO FIND Med Name: pt reports taking preservision bid.    [provider]      Allergies    Patient has no known allergies.    Review of Systems   Review of Systems  All other systems reviewed and are negative.   Physical Exam Updated Vital Signs BP 134/79   Pulse 82   Temp 98.7 F (37.1 C) (Oral)   Resp 14   Ht 6\' 1"  (1.854 m)   Wt 83.9 kg   SpO2 94%   BMI 24.41 kg/m  Physical Exam Vitals and nursing note reviewed.  Constitutional:      Appearance: He is well-developed.  HENT:     Head: Atraumatic.  Cardiovascular:     Rate and Rhythm: Normal rate.  Pulmonary:     Effort: Pulmonary effort is normal.     Breath sounds: Rales present. No wheezing or rhonchi.  Musculoskeletal:  Cervical back: Neck supple.  Skin:    General: Skin is warm.  Neurological:     Mental Status: He is alert and oriented to person, place, and time.     ED Results / Procedures / Treatments   Labs (all labs ordered are listed, but only abnormal results are displayed) Labs Reviewed  RESP PANEL BY RT-PCR (RSV, FLU A&B, COVID)  [id number]  BASIC METABOLIC PANEL  CBC  TROPONIN I (HIGH SENSITIVITY)  TROPONIN I (HIGH SENSITIVITY)    EKG EKG Interpretation Date/Time:  Sunday June 14 2023 14:33:13 EST Ventricular Rate:  73 PR Interval:  191 QRS Duration:  93 QT Interval:  382 QTC Calculation: 421 R Axis:   8  Text Interpretation: Sinus rhythm Low voltage, precordial leads No acute changes No significant change since last tracing Confirmed by  Derwood Kaplan ([id number]) on 06/14/2023 3:02:34 PM  Radiology DG Chest 2 View  Result Date: 06/14/2023 CLINICAL DATA:  chest pressure EXAM: CHEST - 2 VIEW COMPARISON:  July 22, 2016 FINDINGS: The cardiomediastinal silhouette is unchanged and mildly enlarged in contour. Small LEFT pleural effusion. No pneumothorax. LEFT basilar platelike opacity. Bronchitic markings most predominant the lung bases. Visualized abdomen is unremarkable. Multilevel degenerative changes of the thoracic spine. IMPRESSION: 1. Small LEFT pleural effusion with LEFT basilar atelectasis. 2. Bronchitic markings most predominant the lung bases. This could reflect bronchitis. Electronically Signed   By: Meda Klinefelter M.D.   On: 06/14/2023 15:34    Procedures Procedures    Medications Ordered in ED Medications  methylPREDNISolone sodium succinate (SOLU-MEDROL) 125 mg/2 mL injection 125 mg (has no administration in time range)  albuterol (PROVENTIL) (2.5 MG/3ML) 0.083% nebulizer solution 5 mg (5 mg Nebulization Given 06/14/23 1553)    ED Course/ Medical Decision Making/ A&P                                 Medical Decision Making Amount and/or Complexity of Data Reviewed Labs: ordered. Radiology: ordered.  Risk OTC drugs. Prescription drug management.   This patient presents to the ED with chief complaint(s) of shortness of breath, congestion, chest tightness with pertinent past medical history of COPD and NSTEMI.The complaint involves an extensive differential diagnosis and also carries with it a high risk of complications and morbidity.    The differential diagnosis includes : COPD exacerbation, acute URI, bronchitis, nasal congestion/sinusitis and acute coronary syndrome.  The initial plan is to give patient nebulizer treatment.  Viral panel sent.   Additional history obtained: Additional history obtained from family  Independent labs interpretation:  The following labs were independently  interpreted: CBC is normal.  Independent visualization and interpretation of imaging: - I independently visualized the following imaging with scope of interpretation limited to determining acute life threatening conditions related to emergency care: X-ray of the chest, which revealed no evidence of focal consolidation or pneumonia.  Treatment and Reassessment: Repeat exam reveals clearing of wheezing in all lung fields. Patient is not in any respiratory distress nor is there hypoxia.  Patient feels better after the breathing treatment.  He has albuterol inhalers at home that he rarely has to use.  He and the family are comfortable going home.  Will treat this as bronchitis with as needed albuterol, steroid.  Final Clinical Impression(s) / ED Diagnoses Final diagnoses:  Acute bronchitis, unspecified organism    Rx / DC Orders ED Discharge Orders  Ordered    predniSONE (DELTASONE) 10 MG tablet  Daily        06/14/23 1732    cetirizine (ZYRTEC) 5 MG tablet  Daily        06/14/23 1732              Derwood Kaplan, MD 06/14/23 1736

## 2023-06-14 NOTE — ED Notes (Signed)
Patient transported to X-ray 

## 2023-06-14 NOTE — ED Triage Notes (Addendum)
Pt BIBGEMS from home. Hx of COPD. Pt was having SHOB that started last night and was worse last night with chest pressure but getting better today. Pt wanted to get checked out today. EMS VSS, A&Ox4 and ambulatory upon arrival.
